# Patient Record
Sex: Male | Born: 1955
Health system: Southern US, Community
[De-identification: ages and names within clinical notes are randomized; demographics above are authoritative.]

## PROBLEM LIST (undated history)

## (undated) DIAGNOSIS — G629 Polyneuropathy, unspecified: Secondary | ICD-10-CM

## (undated) DIAGNOSIS — R0683 Snoring: Secondary | ICD-10-CM

## (undated) DIAGNOSIS — I10 Essential (primary) hypertension: Secondary | ICD-10-CM

## (undated) DIAGNOSIS — A0811 Acute gastroenteropathy due to Norwalk agent: Secondary | ICD-10-CM

## (undated) HISTORY — DX: Polyneuropathy, unspecified: G62.9

## (undated) HISTORY — DX: Acute gastroenteropathy due to Norwalk agent: A08.11

## (undated) HISTORY — PX: NASAL SINUS SURGERY: SHX719

## (undated) HISTORY — DX: Snoring: R06.83

## (undated) HISTORY — DX: Essential (primary) hypertension: I10

---

## 2016-01-03 DIAGNOSIS — E789 Disorder of lipoprotein metabolism, unspecified: Secondary | ICD-10-CM | POA: Diagnosis not present

## 2016-01-10 DIAGNOSIS — I10 Essential (primary) hypertension: Secondary | ICD-10-CM | POA: Diagnosis not present

## 2016-01-10 DIAGNOSIS — G629 Polyneuropathy, unspecified: Secondary | ICD-10-CM | POA: Diagnosis not present

## 2016-01-10 DIAGNOSIS — E789 Disorder of lipoprotein metabolism, unspecified: Secondary | ICD-10-CM | POA: Diagnosis not present

## 2016-02-19 DIAGNOSIS — H26491 Other secondary cataract, right eye: Secondary | ICD-10-CM | POA: Diagnosis not present

## 2016-03-11 DIAGNOSIS — E789 Disorder of lipoprotein metabolism, unspecified: Secondary | ICD-10-CM | POA: Diagnosis not present

## 2016-03-11 DIAGNOSIS — G629 Polyneuropathy, unspecified: Secondary | ICD-10-CM | POA: Diagnosis not present

## 2016-03-11 DIAGNOSIS — M549 Dorsalgia, unspecified: Secondary | ICD-10-CM | POA: Diagnosis not present

## 2016-03-27 DIAGNOSIS — J209 Acute bronchitis, unspecified: Secondary | ICD-10-CM | POA: Diagnosis not present

## 2016-04-03 DIAGNOSIS — E789 Disorder of lipoprotein metabolism, unspecified: Secondary | ICD-10-CM | POA: Diagnosis not present

## 2016-04-03 DIAGNOSIS — Z0000 Encounter for general adult medical examination without abnormal findings: Secondary | ICD-10-CM | POA: Diagnosis not present

## 2016-04-09 DIAGNOSIS — G629 Polyneuropathy, unspecified: Secondary | ICD-10-CM | POA: Diagnosis not present

## 2016-04-09 DIAGNOSIS — E789 Disorder of lipoprotein metabolism, unspecified: Secondary | ICD-10-CM | POA: Diagnosis not present

## 2016-05-04 DIAGNOSIS — J0100 Acute maxillary sinusitis, unspecified: Secondary | ICD-10-CM | POA: Diagnosis not present

## 2016-05-04 DIAGNOSIS — R05 Cough: Secondary | ICD-10-CM | POA: Diagnosis not present

## 2016-07-12 DIAGNOSIS — S40851A Superficial foreign body of right upper arm, initial encounter: Secondary | ICD-10-CM | POA: Diagnosis not present

## 2016-07-15 DIAGNOSIS — J0100 Acute maxillary sinusitis, unspecified: Secondary | ICD-10-CM | POA: Diagnosis not present

## 2016-07-17 DIAGNOSIS — J22 Unspecified acute lower respiratory infection: Secondary | ICD-10-CM | POA: Diagnosis not present

## 2016-08-05 DIAGNOSIS — E538 Deficiency of other specified B group vitamins: Secondary | ICD-10-CM | POA: Diagnosis not present

## 2016-08-05 DIAGNOSIS — E789 Disorder of lipoprotein metabolism, unspecified: Secondary | ICD-10-CM | POA: Diagnosis not present

## 2016-08-13 DIAGNOSIS — G629 Polyneuropathy, unspecified: Secondary | ICD-10-CM | POA: Diagnosis not present

## 2016-08-13 DIAGNOSIS — E789 Disorder of lipoprotein metabolism, unspecified: Secondary | ICD-10-CM | POA: Diagnosis not present

## 2016-08-13 DIAGNOSIS — E291 Testicular hypofunction: Secondary | ICD-10-CM | POA: Diagnosis not present

## 2016-08-19 DIAGNOSIS — M549 Dorsalgia, unspecified: Secondary | ICD-10-CM | POA: Diagnosis not present

## 2016-08-26 DIAGNOSIS — E538 Deficiency of other specified B group vitamins: Secondary | ICD-10-CM | POA: Diagnosis not present

## 2016-08-26 DIAGNOSIS — E291 Testicular hypofunction: Secondary | ICD-10-CM | POA: Diagnosis not present

## 2016-09-02 DIAGNOSIS — E538 Deficiency of other specified B group vitamins: Secondary | ICD-10-CM | POA: Diagnosis not present

## 2016-09-09 DIAGNOSIS — E538 Deficiency of other specified B group vitamins: Secondary | ICD-10-CM | POA: Diagnosis not present

## 2016-09-16 DIAGNOSIS — E538 Deficiency of other specified B group vitamins: Secondary | ICD-10-CM | POA: Diagnosis not present

## 2016-10-10 DIAGNOSIS — J029 Acute pharyngitis, unspecified: Secondary | ICD-10-CM | POA: Diagnosis not present

## 2016-10-10 DIAGNOSIS — I10 Essential (primary) hypertension: Secondary | ICD-10-CM | POA: Diagnosis not present

## 2016-10-10 DIAGNOSIS — E789 Disorder of lipoprotein metabolism, unspecified: Secondary | ICD-10-CM | POA: Diagnosis not present

## 2016-10-10 DIAGNOSIS — G629 Polyneuropathy, unspecified: Secondary | ICD-10-CM | POA: Diagnosis not present

## 2016-10-18 DIAGNOSIS — E538 Deficiency of other specified B group vitamins: Secondary | ICD-10-CM | POA: Diagnosis not present

## 2016-11-01 DIAGNOSIS — J111 Influenza due to unidentified influenza virus with other respiratory manifestations: Secondary | ICD-10-CM | POA: Diagnosis not present

## 2016-11-06 DIAGNOSIS — J329 Chronic sinusitis, unspecified: Secondary | ICD-10-CM | POA: Diagnosis not present

## 2016-11-06 DIAGNOSIS — R05 Cough: Secondary | ICD-10-CM | POA: Diagnosis not present

## 2016-12-04 DIAGNOSIS — E789 Disorder of lipoprotein metabolism, unspecified: Secondary | ICD-10-CM | POA: Diagnosis not present

## 2016-12-11 DIAGNOSIS — E789 Disorder of lipoprotein metabolism, unspecified: Secondary | ICD-10-CM | POA: Diagnosis not present

## 2016-12-11 DIAGNOSIS — I10 Essential (primary) hypertension: Secondary | ICD-10-CM | POA: Diagnosis not present

## 2016-12-11 DIAGNOSIS — G629 Polyneuropathy, unspecified: Secondary | ICD-10-CM | POA: Diagnosis not present

## 2017-02-05 DIAGNOSIS — E789 Disorder of lipoprotein metabolism, unspecified: Secondary | ICD-10-CM | POA: Diagnosis not present

## 2017-02-12 DIAGNOSIS — E789 Disorder of lipoprotein metabolism, unspecified: Secondary | ICD-10-CM | POA: Diagnosis not present

## 2017-02-12 DIAGNOSIS — G629 Polyneuropathy, unspecified: Secondary | ICD-10-CM | POA: Diagnosis not present

## 2017-02-12 DIAGNOSIS — I10 Essential (primary) hypertension: Secondary | ICD-10-CM | POA: Diagnosis not present

## 2017-02-19 DIAGNOSIS — H2512 Age-related nuclear cataract, left eye: Secondary | ICD-10-CM | POA: Diagnosis not present

## 2017-02-19 DIAGNOSIS — H524 Presbyopia: Secondary | ICD-10-CM | POA: Diagnosis not present

## 2017-04-01 ENCOUNTER — Other Ambulatory Visit: Payer: Self-pay | Admitting: Endocrinology

## 2017-04-01 DIAGNOSIS — I158 Other secondary hypertension: Secondary | ICD-10-CM

## 2017-04-10 ENCOUNTER — Ambulatory Visit
Admission: RE | Admit: 2017-04-10 | Discharge: 2017-04-10 | Disposition: A | Discharge status: Home / Self Care | Payer: Self-pay | Source: Ambulatory Visit | Attending: Endocrinology | Admitting: Endocrinology

## 2017-04-10 ENCOUNTER — Other Ambulatory Visit: Payer: Self-pay

## 2017-04-10 ENCOUNTER — Ambulatory Visit
Admission: RE | Admit: 2017-04-10 | Discharge: 2017-04-10 | Disposition: A | Discharge status: Home / Self Care | Payer: BLUE CROSS/BLUE SHIELD | Source: Ambulatory Visit | Attending: Endocrinology | Admitting: Endocrinology

## 2017-04-10 DIAGNOSIS — I10 Essential (primary) hypertension: Secondary | ICD-10-CM | POA: Diagnosis not present

## 2017-04-10 DIAGNOSIS — I158 Other secondary hypertension: Secondary | ICD-10-CM

## 2017-04-30 DIAGNOSIS — Z6833 Body mass index (BMI) 33.0-33.9, adult: Secondary | ICD-10-CM | POA: Diagnosis not present

## 2017-04-30 DIAGNOSIS — I10 Essential (primary) hypertension: Secondary | ICD-10-CM | POA: Diagnosis not present

## 2017-05-05 ENCOUNTER — Other Ambulatory Visit: Payer: Self-pay | Admitting: Nephrology

## 2017-05-07 DIAGNOSIS — H6123 Impacted cerumen, bilateral: Secondary | ICD-10-CM | POA: Diagnosis not present

## 2017-05-07 DIAGNOSIS — J309 Allergic rhinitis, unspecified: Secondary | ICD-10-CM | POA: Diagnosis not present

## 2017-05-19 ENCOUNTER — Other Ambulatory Visit: Payer: Self-pay | Admitting: Nephrology

## 2017-05-19 DIAGNOSIS — I701 Atherosclerosis of renal artery: Secondary | ICD-10-CM

## 2017-06-09 DIAGNOSIS — Z125 Encounter for screening for malignant neoplasm of prostate: Secondary | ICD-10-CM | POA: Diagnosis not present

## 2017-06-09 DIAGNOSIS — Z0000 Encounter for general adult medical examination without abnormal findings: Secondary | ICD-10-CM | POA: Diagnosis not present

## 2017-06-16 DIAGNOSIS — E538 Deficiency of other specified B group vitamins: Secondary | ICD-10-CM | POA: Diagnosis not present

## 2017-06-16 DIAGNOSIS — I10 Essential (primary) hypertension: Secondary | ICD-10-CM | POA: Diagnosis not present

## 2017-06-16 DIAGNOSIS — Z1212 Encounter for screening for malignant neoplasm of rectum: Secondary | ICD-10-CM | POA: Diagnosis not present

## 2017-06-16 DIAGNOSIS — J309 Allergic rhinitis, unspecified: Secondary | ICD-10-CM | POA: Diagnosis not present

## 2017-06-16 DIAGNOSIS — Z23 Encounter for immunization: Secondary | ICD-10-CM | POA: Diagnosis not present

## 2017-06-16 DIAGNOSIS — L918 Other hypertrophic disorders of the skin: Secondary | ICD-10-CM | POA: Diagnosis not present

## 2017-06-16 DIAGNOSIS — E789 Disorder of lipoprotein metabolism, unspecified: Secondary | ICD-10-CM | POA: Diagnosis not present

## 2017-06-16 DIAGNOSIS — Z0000 Encounter for general adult medical examination without abnormal findings: Secondary | ICD-10-CM | POA: Diagnosis not present

## 2017-07-08 DIAGNOSIS — R490 Dysphonia: Secondary | ICD-10-CM | POA: Diagnosis not present

## 2017-07-29 DIAGNOSIS — I10 Essential (primary) hypertension: Secondary | ICD-10-CM | POA: Diagnosis not present

## 2017-07-29 DIAGNOSIS — E538 Deficiency of other specified B group vitamins: Secondary | ICD-10-CM | POA: Diagnosis not present

## 2017-07-29 DIAGNOSIS — R490 Dysphonia: Secondary | ICD-10-CM | POA: Diagnosis not present

## 2017-08-01 DIAGNOSIS — I10 Essential (primary) hypertension: Secondary | ICD-10-CM | POA: Diagnosis not present

## 2017-08-25 DIAGNOSIS — J309 Allergic rhinitis, unspecified: Secondary | ICD-10-CM | POA: Diagnosis not present

## 2017-09-04 DIAGNOSIS — I10 Essential (primary) hypertension: Secondary | ICD-10-CM | POA: Diagnosis not present

## 2017-09-04 DIAGNOSIS — J0100 Acute maxillary sinusitis, unspecified: Secondary | ICD-10-CM | POA: Diagnosis not present

## 2017-10-07 DIAGNOSIS — I10 Essential (primary) hypertension: Secondary | ICD-10-CM | POA: Diagnosis not present

## 2017-11-11 DIAGNOSIS — E789 Disorder of lipoprotein metabolism, unspecified: Secondary | ICD-10-CM | POA: Diagnosis not present

## 2017-11-11 DIAGNOSIS — I10 Essential (primary) hypertension: Secondary | ICD-10-CM | POA: Diagnosis not present

## 2017-11-12 DIAGNOSIS — I10 Essential (primary) hypertension: Secondary | ICD-10-CM | POA: Diagnosis not present

## 2017-11-22 DIAGNOSIS — R51 Headache: Secondary | ICD-10-CM | POA: Diagnosis not present

## 2017-11-22 DIAGNOSIS — R0989 Other specified symptoms and signs involving the circulatory and respiratory systems: Secondary | ICD-10-CM | POA: Diagnosis not present

## 2018-01-16 DIAGNOSIS — J069 Acute upper respiratory infection, unspecified: Secondary | ICD-10-CM | POA: Diagnosis not present

## 2018-02-20 DIAGNOSIS — H2512 Age-related nuclear cataract, left eye: Secondary | ICD-10-CM | POA: Diagnosis not present

## 2018-04-27 IMAGING — US US RENAL ARTERY STENOSIS
1 series · 13 of 25 positions shown · non-contrast
Comparison: None.

CLINICAL DATA: Hypertension, uncontrolled.

EXAM:
RENAL/URINARY TRACT ULTRASOUND
RENAL DUPLEX DOPPLER ULTRASOUND

[Series 1: us renal artery stenosis · 0.25mm/px · 13 of 70 slices shown]
[im 1/70]
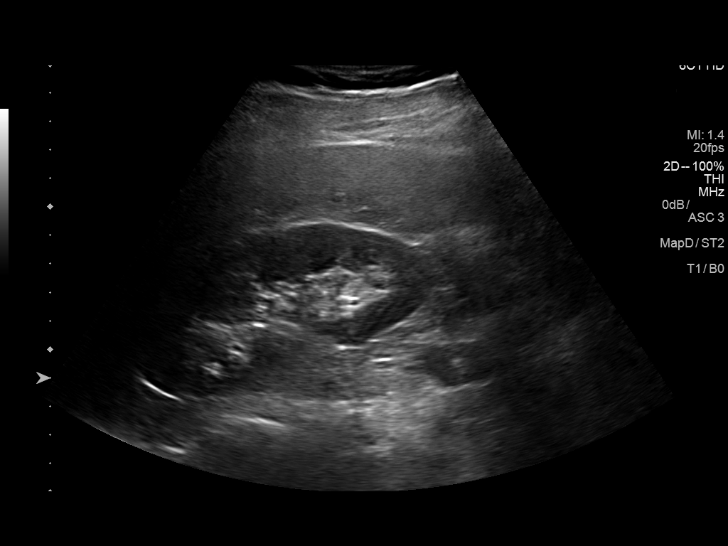
[im 6/70]
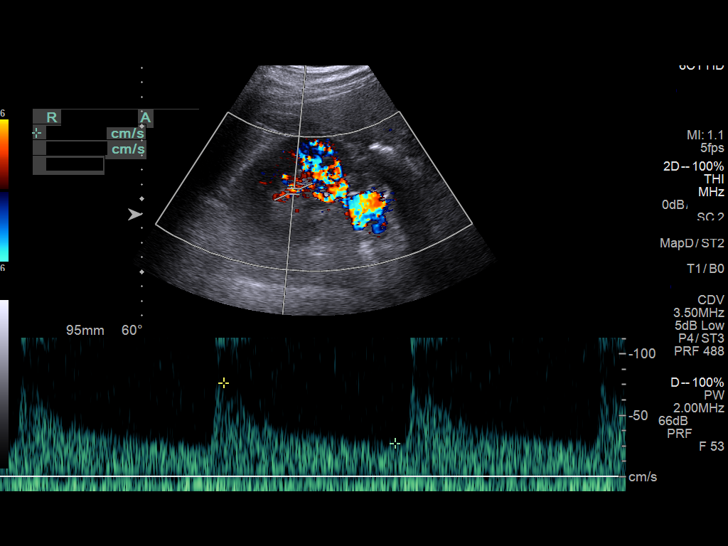
[im 12/70]
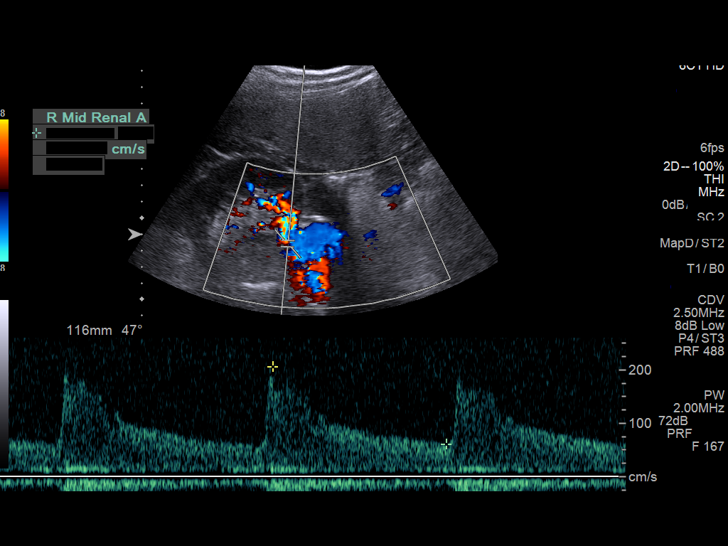
[im 18/70]
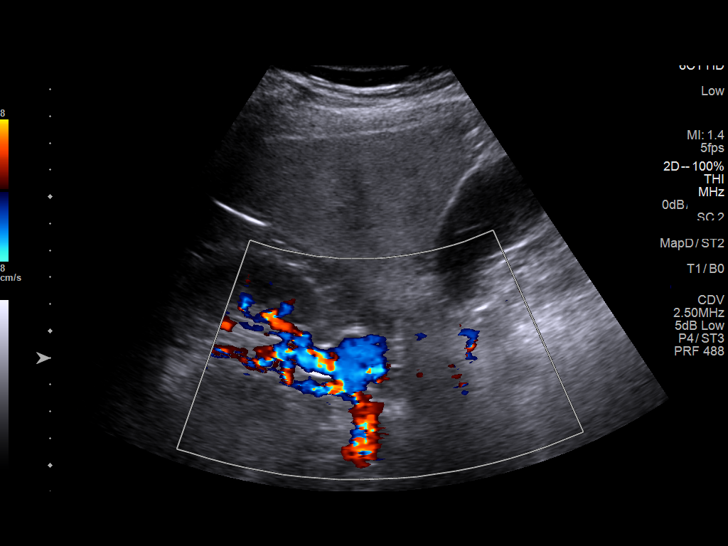
[im 24/70]
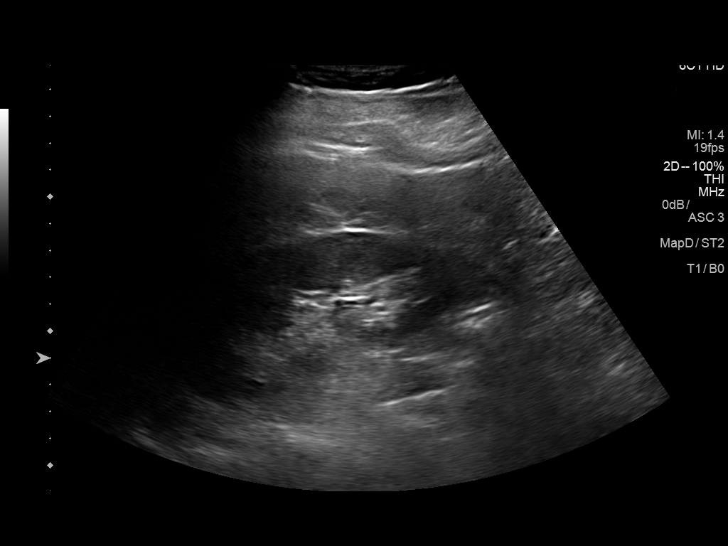
[im 29/70]
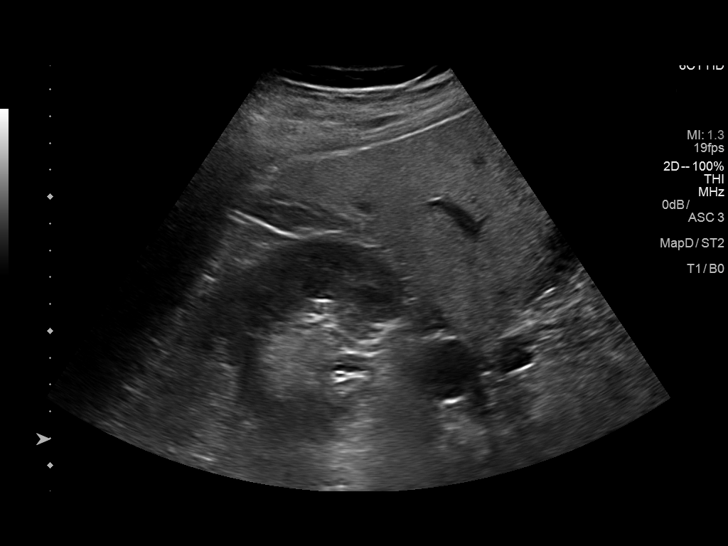
[im 35/70]
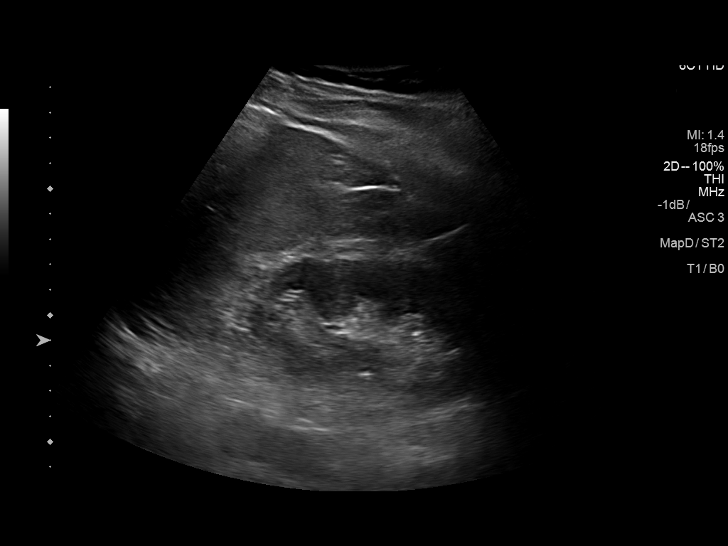
[im 41/70]
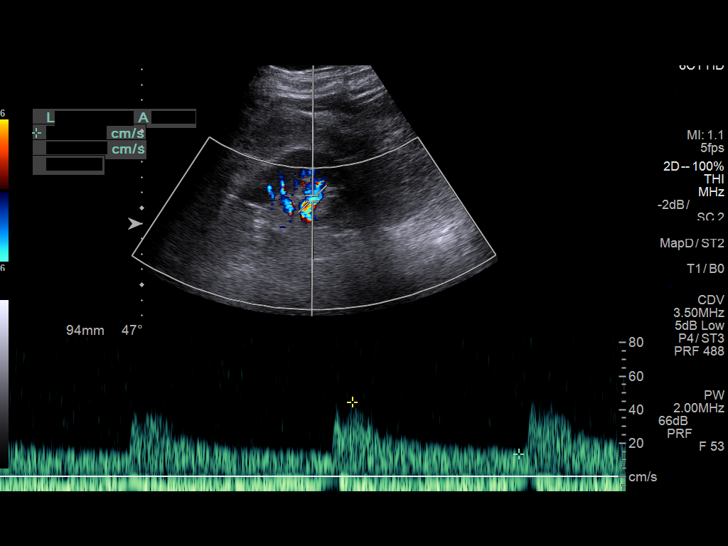
[im 47/70]
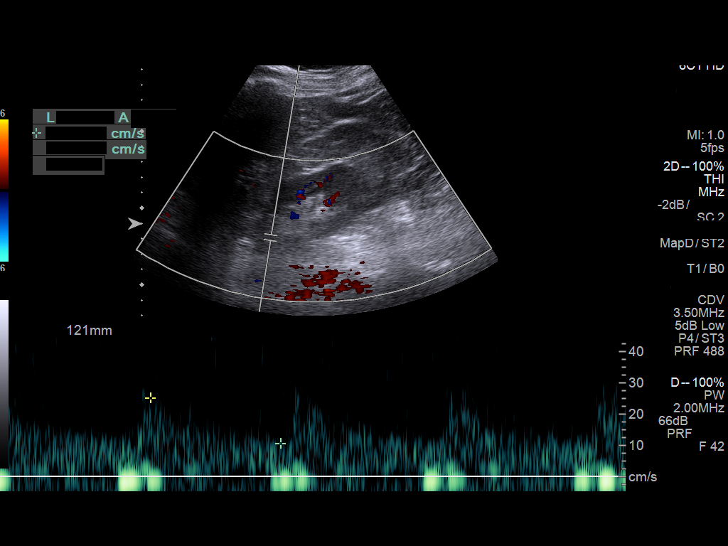
[im 52/70]
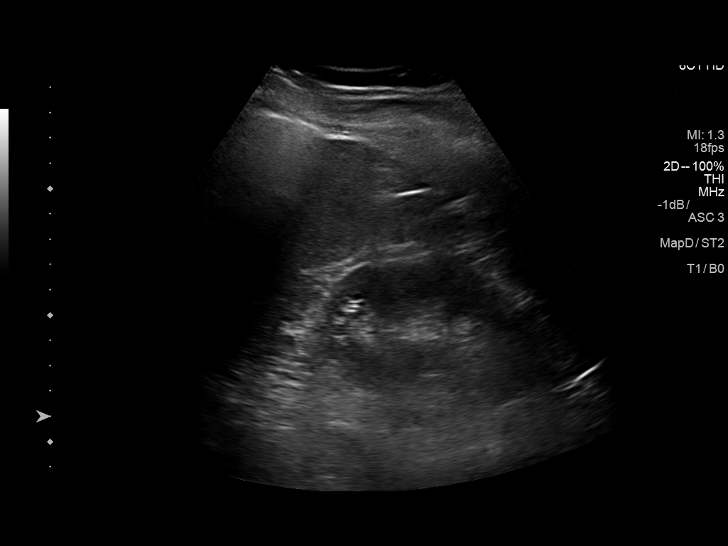
[im 58/70]
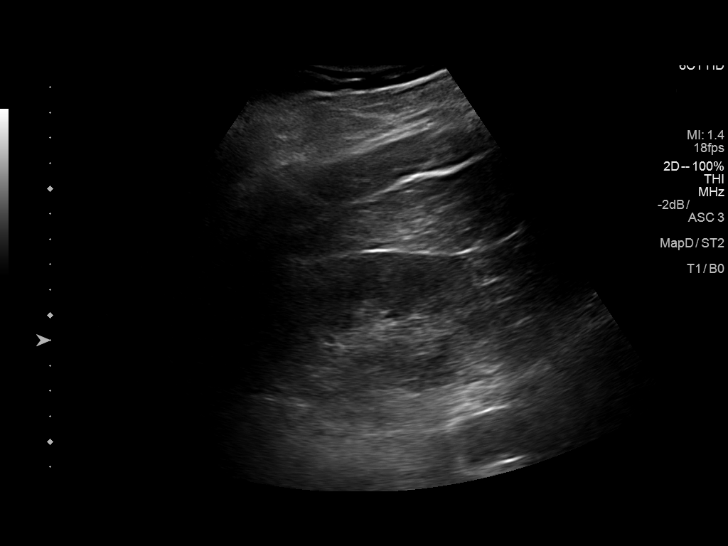
[im 64/70]
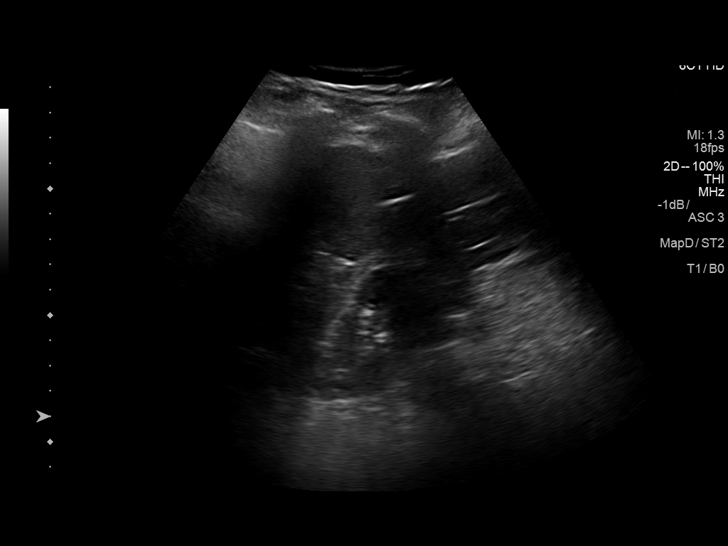
[im 70/70]
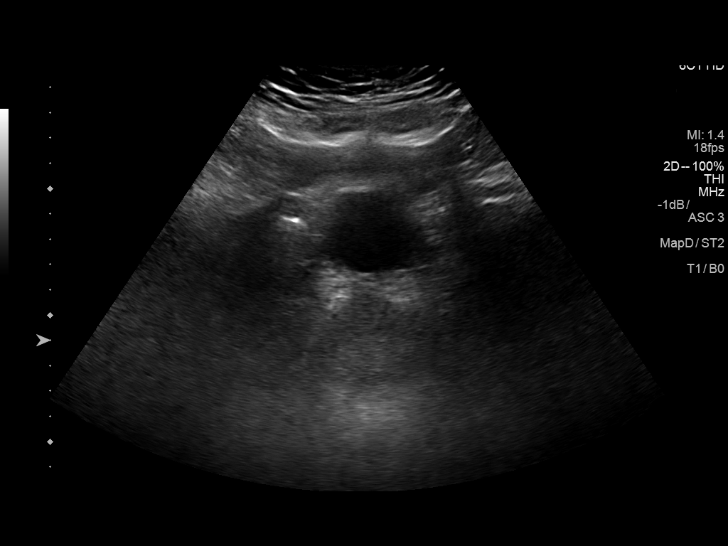

[13 of 25 positions shown; findings below may reference images not displayed]

FINDINGS: Right Kidney:

Length: 11.5 cm. Echogenicity within normal limits. No mass or
hydronephrosis visualized. Antegrade flow in the renal vein.

Left Kidney:

Length: 10.8 cm. Echogenicity within normal limits. No mass or
hydronephrosis visualized. Antegrade flow in the renal vein.

Bladder:  Incompletely distended, unremarkable

RENAL DUPLEX ULTRASOUND

Right Renal Artery Velocities:

Origin:  96 cm/sec

Mid:  207 cm/sec

Hilum:  307 cm/sec

Interlobar:  57 cm/sec

Arcuate:  18 Cm/sec

Left Renal Artery Velocities:

Origin:  145 cm/sec

Mid:  71 cm/sec

Hilum:  78 cm/sec

Interlobar:  42 cm/sec

Arcuate:  19 cm/sec

Aortic Velocity:  92 Cm/sec

Right Renal-Aortic Ratios:

Origin:

Mid:

Hilum:

Interlobar:

Arcuate:

Left Renal-Aortic Ratios:

Origin:

Mid:

Hilum:

Interlobar:

Arcuate:
IMPRESSION: 1. Elevated peak systolic velocities in the right renal artery
suggesting hemodynamically significant stenosis. Consider catheter
angiography for further evaluation, as the lesion may be amenable to
percutaneous treatment if confirmed to be significant.

## 2018-05-26 DIAGNOSIS — J039 Acute tonsillitis, unspecified: Secondary | ICD-10-CM | POA: Diagnosis not present

## 2018-05-26 DIAGNOSIS — J069 Acute upper respiratory infection, unspecified: Secondary | ICD-10-CM | POA: Diagnosis not present

## 2018-06-15 DIAGNOSIS — I10 Essential (primary) hypertension: Secondary | ICD-10-CM | POA: Diagnosis not present

## 2018-06-15 DIAGNOSIS — Z125 Encounter for screening for malignant neoplasm of prostate: Secondary | ICD-10-CM | POA: Diagnosis not present

## 2018-06-23 DIAGNOSIS — L219 Seborrheic dermatitis, unspecified: Secondary | ICD-10-CM | POA: Diagnosis not present

## 2018-06-23 DIAGNOSIS — Z1212 Encounter for screening for malignant neoplasm of rectum: Secondary | ICD-10-CM | POA: Diagnosis not present

## 2018-06-23 DIAGNOSIS — Z23 Encounter for immunization: Secondary | ICD-10-CM | POA: Diagnosis not present

## 2018-06-23 DIAGNOSIS — Z0000 Encounter for general adult medical examination without abnormal findings: Secondary | ICD-10-CM | POA: Diagnosis not present

## 2018-07-02 DIAGNOSIS — H60539 Acute contact otitis externa, unspecified ear: Secondary | ICD-10-CM | POA: Diagnosis not present

## 2018-08-06 DIAGNOSIS — I10 Essential (primary) hypertension: Secondary | ICD-10-CM | POA: Diagnosis not present

## 2018-11-03 DIAGNOSIS — J312 Chronic pharyngitis: Secondary | ICD-10-CM | POA: Diagnosis not present

## 2018-11-03 DIAGNOSIS — J028 Acute pharyngitis due to other specified organisms: Secondary | ICD-10-CM | POA: Diagnosis not present

## 2018-11-24 DIAGNOSIS — J411 Mucopurulent chronic bronchitis: Secondary | ICD-10-CM | POA: Diagnosis not present

## 2019-02-17 DIAGNOSIS — G629 Polyneuropathy, unspecified: Secondary | ICD-10-CM | POA: Diagnosis not present

## 2019-03-24 DIAGNOSIS — H25812 Combined forms of age-related cataract, left eye: Secondary | ICD-10-CM | POA: Diagnosis not present

## 2019-04-22 DIAGNOSIS — G629 Polyneuropathy, unspecified: Secondary | ICD-10-CM | POA: Diagnosis not present

## 2019-06-25 DIAGNOSIS — Z1159 Encounter for screening for other viral diseases: Secondary | ICD-10-CM | POA: Diagnosis not present

## 2019-06-25 DIAGNOSIS — Z0000 Encounter for general adult medical examination without abnormal findings: Secondary | ICD-10-CM | POA: Diagnosis not present

## 2019-06-25 DIAGNOSIS — Z125 Encounter for screening for malignant neoplasm of prostate: Secondary | ICD-10-CM | POA: Diagnosis not present

## 2019-06-25 DIAGNOSIS — I10 Essential (primary) hypertension: Secondary | ICD-10-CM | POA: Diagnosis not present

## 2019-06-30 DIAGNOSIS — I10 Essential (primary) hypertension: Secondary | ICD-10-CM | POA: Diagnosis not present

## 2019-06-30 DIAGNOSIS — E789 Disorder of lipoprotein metabolism, unspecified: Secondary | ICD-10-CM | POA: Diagnosis not present

## 2019-06-30 DIAGNOSIS — Z23 Encounter for immunization: Secondary | ICD-10-CM | POA: Diagnosis not present

## 2019-06-30 DIAGNOSIS — Z0000 Encounter for general adult medical examination without abnormal findings: Secondary | ICD-10-CM | POA: Diagnosis not present

## 2019-09-06 DIAGNOSIS — H40052 Ocular hypertension, left eye: Secondary | ICD-10-CM | POA: Diagnosis not present

## 2019-10-05 DIAGNOSIS — Z20828 Contact with and (suspected) exposure to other viral communicable diseases: Secondary | ICD-10-CM | POA: Diagnosis not present

## 2019-10-05 DIAGNOSIS — J3489 Other specified disorders of nose and nasal sinuses: Secondary | ICD-10-CM | POA: Diagnosis not present

## 2019-10-26 DIAGNOSIS — E538 Deficiency of other specified B group vitamins: Secondary | ICD-10-CM | POA: Diagnosis not present

## 2019-10-26 DIAGNOSIS — R7301 Impaired fasting glucose: Secondary | ICD-10-CM | POA: Diagnosis not present

## 2019-10-26 DIAGNOSIS — R400 Somnolence: Secondary | ICD-10-CM | POA: Diagnosis not present

## 2019-10-26 DIAGNOSIS — I10 Essential (primary) hypertension: Secondary | ICD-10-CM | POA: Diagnosis not present

## 2019-10-26 DIAGNOSIS — E785 Hyperlipidemia, unspecified: Secondary | ICD-10-CM | POA: Diagnosis not present

## 2019-10-26 DIAGNOSIS — R7303 Prediabetes: Secondary | ICD-10-CM | POA: Diagnosis not present

## 2019-11-24 DIAGNOSIS — L2084 Intrinsic (allergic) eczema: Secondary | ICD-10-CM | POA: Diagnosis not present

## 2020-01-25 DIAGNOSIS — I10 Essential (primary) hypertension: Secondary | ICD-10-CM | POA: Diagnosis not present

## 2020-01-25 DIAGNOSIS — R7303 Prediabetes: Secondary | ICD-10-CM | POA: Diagnosis not present

## 2020-01-25 DIAGNOSIS — G629 Polyneuropathy, unspecified: Secondary | ICD-10-CM | POA: Diagnosis not present

## 2020-01-25 DIAGNOSIS — E785 Hyperlipidemia, unspecified: Secondary | ICD-10-CM | POA: Diagnosis not present

## 2020-02-09 DIAGNOSIS — G4719 Other hypersomnia: Secondary | ICD-10-CM | POA: Diagnosis not present

## 2020-02-17 DIAGNOSIS — J029 Acute pharyngitis, unspecified: Secondary | ICD-10-CM | POA: Diagnosis not present

## 2020-03-06 DIAGNOSIS — H40052 Ocular hypertension, left eye: Secondary | ICD-10-CM | POA: Diagnosis not present

## 2020-03-08 DIAGNOSIS — G4733 Obstructive sleep apnea (adult) (pediatric): Secondary | ICD-10-CM | POA: Diagnosis not present

## 2020-03-08 DIAGNOSIS — G4719 Other hypersomnia: Secondary | ICD-10-CM | POA: Diagnosis not present

## 2020-06-06 DIAGNOSIS — R400 Somnolence: Secondary | ICD-10-CM | POA: Diagnosis not present

## 2020-06-06 DIAGNOSIS — Z0000 Encounter for general adult medical examination without abnormal findings: Secondary | ICD-10-CM | POA: Diagnosis not present

## 2020-07-05 DIAGNOSIS — J069 Acute upper respiratory infection, unspecified: Secondary | ICD-10-CM | POA: Diagnosis not present

## 2020-07-05 DIAGNOSIS — J029 Acute pharyngitis, unspecified: Secondary | ICD-10-CM | POA: Diagnosis not present

## 2020-07-06 DIAGNOSIS — Z125 Encounter for screening for malignant neoplasm of prostate: Secondary | ICD-10-CM | POA: Diagnosis not present

## 2020-07-06 DIAGNOSIS — Z0000 Encounter for general adult medical examination without abnormal findings: Secondary | ICD-10-CM | POA: Diagnosis not present

## 2020-07-06 DIAGNOSIS — I10 Essential (primary) hypertension: Secondary | ICD-10-CM | POA: Diagnosis not present

## 2020-07-06 DIAGNOSIS — E538 Deficiency of other specified B group vitamins: Secondary | ICD-10-CM | POA: Diagnosis not present

## 2020-07-11 DIAGNOSIS — Z23 Encounter for immunization: Secondary | ICD-10-CM | POA: Diagnosis not present

## 2020-07-11 DIAGNOSIS — G629 Polyneuropathy, unspecified: Secondary | ICD-10-CM | POA: Diagnosis not present

## 2020-07-11 DIAGNOSIS — G4733 Obstructive sleep apnea (adult) (pediatric): Secondary | ICD-10-CM | POA: Diagnosis not present

## 2020-07-11 DIAGNOSIS — G4719 Other hypersomnia: Secondary | ICD-10-CM | POA: Diagnosis not present

## 2020-07-11 DIAGNOSIS — I10 Essential (primary) hypertension: Secondary | ICD-10-CM | POA: Diagnosis not present

## 2020-07-13 ENCOUNTER — Encounter: Payer: Self-pay | Admitting: Neurology

## 2020-07-13 ENCOUNTER — Ambulatory Visit: Payer: BC Managed Care – PPO | Admitting: Neurology

## 2020-07-13 VITALS — BP 122/84 | HR 74 | Ht 72.0 in | Wt 232.3 lb

## 2020-07-13 DIAGNOSIS — R519 Headache, unspecified: Secondary | ICD-10-CM

## 2020-07-13 DIAGNOSIS — R351 Nocturia: Secondary | ICD-10-CM | POA: Diagnosis not present

## 2020-07-13 DIAGNOSIS — E66811 Obesity, class 1: Secondary | ICD-10-CM

## 2020-07-13 DIAGNOSIS — G4719 Other hypersomnia: Secondary | ICD-10-CM

## 2020-07-13 DIAGNOSIS — E669 Obesity, unspecified: Secondary | ICD-10-CM

## 2020-07-13 DIAGNOSIS — R0683 Snoring: Secondary | ICD-10-CM

## 2020-07-13 NOTE — Patient Instructions (Signed)

## 2020-07-13 NOTE — Progress Notes (Signed)
Subjective:    Patient ID: Jeffrey Luna is a 64 y.o. male.  HPI     Jeffrey Foley, Jeffrey Luna, Jeffrey Luna [facility] [address] [address] [place], [place] [address]  Dear Dr. Renne Crigler,   I saw your patient, Jeffrey Luna, upon your kind request in my sleep clinic today for initial consultation of his sleep disorder, in particular, concern for underlying obstructive sleep apnea.  The patient is unaccompanied today.  As you know, Jeffrey Luna is a 64 year old right-handed gentleman with an underlying medical history of hypertension, vitamin B12 deficiency, edema, psoriasis, allergic rhinitis, renal artery stenosis, hyperlipidemia, reflux disease, neuropathy and obesity, who reports snoring and excessive daytime somnolence.  I reviewed your office note from 06/06/2020.  He has been on modafinil.  His duloxetine was recently reduced. In fact, he stopped taking it.  Of note, he also takes Lyrica 150 mg twice daily.  He has been on Lyrica for several years.  Modafinil is 200 mg once daily.  His Epworth sleepiness score is 12 out of 24, fatigue severity score is 30 out of 63.  He is married and lives with his wife, they have 2 grown children.  He works as a Education officer, environmental.  They have 1 cat in the household.  Bedtime is generally around 11 but he often falls asleep before then.  He sleeps in a recliner.  He has done so for the past 2 or 3 years.  Rise time is between 530 and 6 AM.  He has nocturia about 2-3 times per average night and has had the occasional morning headache.  He has become sleepy at the wheel at times but has not fallen asleep at the wheel.  He has had a 40 pound weight gain over the past 5 years approximately.  He quit smoking some 25 to 30 years ago, drinks no alcohol, caffeine in the form of soda, 1/day on average.  His Past Medical History Is Significant For: Past Medical History:  Diagnosis Date  . High blood pressure   . Neuropathy   . Snoring     His Past Surgical  History Is Significant For: Past Surgical History:  Procedure Laterality Date  . NASAL SINUS SURGERY      His Family History Is Significant For: Family History  Problem Relation Age of Onset  . Dementia Mother   . Heart attack Father     His Social History Is Significant For: Social History   Socioeconomic History  . Marital status: Married    Spouse name: Not on file  . Number of children: Not on file  . Years of education: Not on file  . Highest education level: Not on file  Occupational History  . Not on file  Tobacco Use  . Smoking status: Former Smoker    Quit date: 2010    Years since quitting: 11.7  . Smokeless tobacco: Never Used  Substance and Sexual Activity  . Alcohol use: Never  . Drug use: Never  . Sexual activity: Not on file  Other Topics Concern  . Not on file  Social History Narrative  . Not on file   Social Determinants of Health   Financial Resource Strain:   . Difficulty of Paying Living Expenses: Not on file  Food Insecurity:   . Worried About Programme researcher, broadcasting/film/video in the Last Year: Not on file  . Ran Out of Food in the Last Year: Not on file  Transportation Needs:   .  Lack of Transportation (Medical): Not on file  . Lack of Transportation (Non-Medical): Not on file  Physical Activity:   . Days of Exercise per Week: Not on file  . Minutes of Exercise per Session: Not on file  Stress:   . Feeling of Stress : Not on file  Social Connections:   . Frequency of Communication with Friends and Family: Not on file  . Frequency of Social Gatherings with Friends and Family: Not on file  . Attends Religious Services: Not on file  . Active Member of Clubs or Organizations: Not on file  . Attends Banker Meetings: Not on file  . Marital Status: Not on file    His Allergies Are:  Allergies  Allergen Reactions  . Prozac [Fluoxetine]     Heart Rate increased   :   His Current Medications Are:  Outpatient Encounter Medications as of  07/13/2020  Medication Sig  . amLODipine (NORVASC) 5 MG tablet Take 5 mg by mouth daily.  . Ascorbic Acid (VITAMIN C PO) Take by mouth.  . modafinil (PROVIGIL) 100 MG tablet Take 100 mg by mouth daily.  . Multiple Vitamins-Minerals (PRESERVISION AREDS PO) Take by mouth.  . Multiple Vitamins-Minerals (ZINC PO) Take by mouth.  . pantoprazole (PROTONIX) 40 MG tablet Take 40 mg by mouth 2 (two) times daily.  . pregabalin (LYRICA) 150 MG capsule Take 1 capsule by mouth 2 (two) times daily.  . RESTASIS 0.05 % ophthalmic emulsion   . SENNA CO by Combination route.  . triamterene-hydrochlorothiazide (MAXZIDE-25) 37.5-25 MG tablet Take 1 tablet by mouth every morning.   No facility-administered encounter medications on file as of 07/13/2020.  :  Review of Systems:  Out of a complete 14 point review of systems, all are reviewed and negative with the exception of these symptoms as listed below:  Review of Systems  Neurological:       Here for sleep consult. No prior sleep study. Pt reports h/a and snoring is present.  Epworth Sleepiness Scale 0= would never doze 1= slight chance of dozing 2= moderate chance of dozing 3= high chance of dozing  Sitting and reading:3 Watching TV:3 Sitting inactive in a public place (ex. Theater or meeting):2 As a passenger in a car for an hour without a break:1 Lying down to rest in the afternoon:1 Sitting and talking to someone:0 Sitting quietly after lunch (no alcohol):1 In a car, while stopped in traffic:1 Total:12     Objective:  Neurological Exam  Physical Exam Physical Examination:   Vitals:   07/13/20 1330  BP: 122/84  Pulse: 74  SpO2: 95%    General Examination: The patient is a very pleasant 64 y.o. male in no acute distress. He appears well-developed and well-nourished and well groomed.   HEENT: Normocephalic, atraumatic, pupils are equal, round and reactive to light, extraocular tracking is good without limitation to gaze excursion  or nystagmus noted. Hearing is grossly intact. Face is symmetric with normal facial animation. Speech is clear with no dysarthria noted. There is no hypophonia. There is no lip, neck/head, jaw or voice tremor. Neck is supple with full range of passive and active motion. There are no carotid bruits on auscultation. Oropharynx exam reveals: moderate mouth dryness, adequate dental hygiene with several missing teeth, mild airway crowding secondary to small airway entry and redundant soft palate, tonsils on the smaller side, Mallampati class III, neck circumference of 18 inches.  Tongue protrudes centrally in palate elevates symmetrically.   Chest: Clear  to auscultation without wheezing, rhonchi or crackles noted.  Heart: S1+S2+0, regular and normal without murmurs, rubs or gallops noted.   Abdomen: Soft, non-tender and non-distended with normal bowel sounds appreciated on auscultation.  Extremities: There is trace pitting edema in the distal lower extremities bilaterally.   Skin: Warm and dry with evidence of psoriatic plaques particularly in the right shin area and over the left elbow.    Musculoskeletal: exam reveals no obvious joint deformities, tenderness or joint swelling or erythema.   Neurologically:  Mental status: The patient is awake, alert and oriented in all 4 spheres. His immediate and remote memory, attention, language skills and fund of knowledge are appropriate. There is no evidence of aphasia, agnosia, apraxia or anomia. Speech is clear with normal prosody and enunciation. Thought process is linear. Mood is normal and affect is normal.  Cranial nerves II - XII are as described above under HEENT exam.  Motor exam: Normal bulk, strength and tone is noted. There is no tremor, fine motor skills and coordination: grossly intact.  Cerebellar testing: No dysmetria or intention tremor. There is no truncal or gait ataxia.  Sensory exam: intact to light touch in the upper and lower extremities.   Gait, station and balance: He stands easily. No veering to one side is noted. No leaning to one side is noted. Posture is age-appropriate and stance is narrow based. Gait shows normal stride length and normal pace. No problems turning are noted.   Assessment and plan:  In summary, Jeffrey Luna is a very pleasant 64 y.o.-year old male  with an underlying medical history of hypertension, vitamin B12 deficiency, edema, psoriasis, allergic rhinitis, renal artery stenosis, hyperlipidemia, reflux disease, neuropathy and obesity, whose history and physical exam are concerning for obstructive sleep apnea (OSA). I had a long chat with the patient about my findings and the diagnosis of OSA, its prognosis and treatment options. We talked about medical treatments, surgical interventions and non-pharmacological approaches. I explained in particular the risks and ramifications of untreated moderate to severe OSA, especially with respect to developing cardiovascular disease down the Road, including congestive heart failure, difficult to treat hypertension, cardiac arrhythmias, or stroke. Even type 2 diabetes has, in part, been linked to untreated OSA. Symptoms of untreated OSA include daytime sleepiness, memory problems, mood irritability and mood disorder such as depression and anxiety, lack of energy, as well as recurrent headaches, especially morning headaches. We talked about trying to maintain a healthy lifestyle in general, as well as the importance of weight control. We also talked about the importance of good sleep hygiene. I recommended the following at this time: sleep study.  He is advised not to drive when feeling sleepy. I explained the sleep test procedure to the patient and also outlined possible surgical and non-surgical treatment options of OSA, including the use of a custom-made dental device (which would require a referral to a specialist dentist or oral surgeon), upper airway surgical options, such as  traditional UPPP or a novel less invasive surgical option in the form of Inspire hypoglossal nerve stimulation (which would involve a referral to an ENT surgeon). I answered all his questions today and the patient was in agreement. I plan to see him back after the sleep study is completed and encouraged him to call with any interim questions, concerns, problems or updates.   Thank you very much for allowing me to participate in the care of this nice patient. If I can be of any further assistance to you  please do not hesitate to call me at [phone].  Sincerely,   Star Age, Jeffrey Luna, Jeffrey Luna

## 2020-07-20 DIAGNOSIS — L821 Other seborrheic keratosis: Secondary | ICD-10-CM | POA: Diagnosis not present

## 2020-07-20 DIAGNOSIS — L299 Pruritus, unspecified: Secondary | ICD-10-CM | POA: Diagnosis not present

## 2020-07-20 DIAGNOSIS — D485 Neoplasm of uncertain behavior of skin: Secondary | ICD-10-CM | POA: Diagnosis not present

## 2020-08-09 DIAGNOSIS — L400 Psoriasis vulgaris: Secondary | ICD-10-CM | POA: Diagnosis not present

## 2020-08-28 ENCOUNTER — Ambulatory Visit (INDEPENDENT_AMBULATORY_CARE_PROVIDER_SITE_OTHER): Payer: BC Managed Care – PPO | Admitting: Neurology

## 2020-08-28 DIAGNOSIS — R351 Nocturia: Secondary | ICD-10-CM

## 2020-08-28 DIAGNOSIS — G4719 Other hypersomnia: Secondary | ICD-10-CM

## 2020-08-28 DIAGNOSIS — E669 Obesity, unspecified: Secondary | ICD-10-CM

## 2020-08-28 DIAGNOSIS — G4733 Obstructive sleep apnea (adult) (pediatric): Secondary | ICD-10-CM | POA: Diagnosis not present

## 2020-08-28 DIAGNOSIS — R519 Headache, unspecified: Secondary | ICD-10-CM

## 2020-08-28 DIAGNOSIS — R0683 Snoring: Secondary | ICD-10-CM

## 2020-09-01 ENCOUNTER — Telehealth: Payer: Self-pay | Admitting: Neurology

## 2020-09-01 NOTE — Procedures (Signed)
   [facility]  HOME SLEEP TEST (Watch PAT)  STUDY DATE: 08/28/20  DOB: 1956-07-02  MRN: [id number]  ORDERING CLINICIAN: Huston Foley, MD, PhD   REFERRING CLINICIAN: Merri Brunette, MD   CLINICAL INFORMATION/HISTORY: 64 year old man with a history of hypertension, vitamin B12 deficiency, edema, psoriasis, allergic rhinitis, renal artery stenosis, hyperlipidemia, reflux disease, neuropathy and obesity, who reports snoring and excessive daytime somnolence.  Epworth sleepiness score: 12/24.  BMI: 31.4 kg/m  FINDINGS:   Total Record Time (hours, min): 8 H 16 min  Total Sleep Time (hours, min):  6 H 15 min   Percent REM (%):    15.84%   Calculated pAHI (per hour):  53.1      REM pAHI: 30.7     NREM pAHI: 57.3   Oxygen Saturation (%) Mean: 96  Minimum oxygen saturation (%):        82   O2 Saturation Range (%): 82-100  O2Saturation (minutes) <=88%: 1.6   Pulse Mean (bpm):    53  Pulse Range (53-88)   IMPRESSION: OSA (obstructive sleep apnea), severe   RECOMMENDATION:  This home sleep test demonstrates severe obstructive sleep apnea with a total AHI of 53.1/hour and O2 nadir of 82%.  Mild to moderate snoring was noted.  Treatment with positive airway pressure is recommended. The patient will be advised to proceed with an autoPAP titration/trial at home for now. A full night titration study may be considered to optimize treatment settings, if needed down the road.  Alternative treatment options may be limited secondary to the severity of his sleep disordered breathing.  Weight loss should be pursued.  Please note, that untreated obstructive sleep apnea may carry additional perioperative morbidity. Patients with significant obstructive sleep apnea should receive perioperative PAP therapy and the surgeons and particularly the anesthesiologist should be informed of the diagnosis and the severity of the sleep disordered breathing. The patient should be cautioned not to  drive, work at heights, or operate dangerous or heavy equipment when tired or sleepy. Review and reiteration of good sleep hygiene measures should be pursued with any patient. Other causes of the patient's symptoms, including circadian rhythm disturbances, an underlying mood disorder, medication effect and/or an underlying medical problem cannot be ruled out based on this test. Clinical correlation is recommended. The patient and his referring provider will be notified of the test results. The patient will be seen in follow up in sleep clinic at [facility].  I certify that I have reviewed the raw data recording prior to the issuance of this report in accordance with the standards of the American Academy of Sleep Medicine (AASM).  INTERPRETING PHYSICIAN:  Huston Foley, MD, PhD  Board Certified in Neurology and Sleep Medicine [facility] [address] [place], [place] [address] [phone]

## 2020-09-01 NOTE — Progress Notes (Signed)
Patient referred by Dr. Renne Crigler, seen by me on 07/13/20, HST on 08/28/20.    Please call and notify the patient that the recent home sleep test showed obstructive sleep apnea in the severe range. I recommend treatment in the form of autoPAP, which means, that we don't have to bring him in for a sleep study with CPAP, but will let him start using/try an autoPAP machine at home, through a DME company (of his choice, or as per insurance requirement). The DME representative will educate him on how to use the machine, how to put the mask on, etc. I have placed an order in the chart. Please send referral, talk to patient, send report to referring MD. We will need a FU in sleep clinic for 10 weeks post-PAP set up, please arrange that with me or one of our NPs. Thanks,   Huston Foley, MD, PhD [facility])

## 2020-09-01 NOTE — Addendum Note (Signed)
Addended by: Huston Foley on: 09/01/2020 09:53 AM   Modules accepted: Orders

## 2020-09-01 NOTE — Telephone Encounter (Signed)
Called patient to discuss sleep study results. No answer at this time. LVM for the patient to call back.   

## 2020-09-01 NOTE — Telephone Encounter (Signed)
-----   Message from Huston Foley, MD sent at 09/01/2020  9:53 AM EST ----- Patient referred by Dr. Renne Crigler, seen by me on 07/13/20, HST on 08/28/20.    Please call and notify the patient that the recent home sleep test showed obstructive sleep apnea in the severe range. I recommend treatment in the form of autoPAP, which means, that we don't have to bring him in for a sleep study with CPAP, but will let him start using/try an autoPAP machine at home, through a DME company (of his choice, or as per insurance requirement). The DME representative will educate him on how to use the machine, how to put the mask on, etc. I have placed an order in the chart. Please send referral, talk to patient, send report to referring MD. We will need a FU in sleep clinic for 10 weeks post-PAP set up, please arrange that with me or one of our NPs. Thanks,   Huston Foley, MD, PhD [facility])

## 2020-09-04 NOTE — Telephone Encounter (Signed)
I called pt. I advised pt that Dr. Frances Furbish reviewed their sleep study results and found that pt has severe osa. Dr. Frances Furbish recommends that pt start an auto pap at home. I reviewed PAP compliance expectations with the pt. Pt is agreeable to starting an auto-PAP. I advised pt that an order will be sent to a DME, [facility], and [facility] will call the pt within about one week after they file with the pt's insurance. [facility] will show the pt how to use the machine, fit for masks, and troubleshoot the auto-PAP if needed. A follow up appt was made for insurance purposes with Dr. Frances Furbish on 12/26/20 at 9:30am. Pt verbalized understanding to arrive 15 minutes early and bring their auto-PAP. A letter with all of this information in it will be mailed to the pt as a reminder. I verified with the pt that the address we have on file is correct. Pt verbalized understanding of results. Pt had no questions at this time but was encouraged to call back if questions arise. I have sent the order to [facility] and have received confirmation that they have received the order.

## 2020-09-12 DIAGNOSIS — E785 Hyperlipidemia, unspecified: Secondary | ICD-10-CM | POA: Diagnosis not present

## 2020-09-13 DIAGNOSIS — H25812 Combined forms of age-related cataract, left eye: Secondary | ICD-10-CM | POA: Diagnosis not present

## 2020-09-14 DIAGNOSIS — G4733 Obstructive sleep apnea (adult) (pediatric): Secondary | ICD-10-CM | POA: Diagnosis not present

## 2020-09-14 DIAGNOSIS — E785 Hyperlipidemia, unspecified: Secondary | ICD-10-CM | POA: Diagnosis not present

## 2020-09-14 DIAGNOSIS — I10 Essential (primary) hypertension: Secondary | ICD-10-CM | POA: Diagnosis not present

## 2020-09-14 DIAGNOSIS — Z6832 Body mass index (BMI) 32.0-32.9, adult: Secondary | ICD-10-CM | POA: Diagnosis not present

## 2020-10-04 DIAGNOSIS — Z20828 Contact with and (suspected) exposure to other viral communicable diseases: Secondary | ICD-10-CM | POA: Diagnosis not present

## 2020-10-31 DIAGNOSIS — L400 Psoriasis vulgaris: Secondary | ICD-10-CM | POA: Diagnosis not present

## 2020-11-02 DIAGNOSIS — G4733 Obstructive sleep apnea (adult) (pediatric): Secondary | ICD-10-CM | POA: Diagnosis not present

## 2020-11-30 DIAGNOSIS — G4733 Obstructive sleep apnea (adult) (pediatric): Secondary | ICD-10-CM | POA: Diagnosis not present

## 2020-12-26 ENCOUNTER — Encounter: Payer: Self-pay | Admitting: Neurology

## 2020-12-26 ENCOUNTER — Ambulatory Visit: Payer: BC Managed Care – PPO | Admitting: Neurology

## 2020-12-26 ENCOUNTER — Other Ambulatory Visit: Payer: Self-pay

## 2020-12-26 VITALS — BP 133/80 | HR 49 | Ht 72.0 in | Wt 231.0 lb

## 2020-12-26 DIAGNOSIS — R001 Bradycardia, unspecified: Secondary | ICD-10-CM | POA: Diagnosis not present

## 2020-12-26 DIAGNOSIS — G4733 Obstructive sleep apnea (adult) (pediatric): Secondary | ICD-10-CM

## 2020-12-26 DIAGNOSIS — M79671 Pain in right foot: Secondary | ICD-10-CM

## 2020-12-26 DIAGNOSIS — Z9989 Dependence on other enabling machines and devices: Secondary | ICD-10-CM

## 2020-12-26 DIAGNOSIS — M79672 Pain in left foot: Secondary | ICD-10-CM

## 2020-12-26 NOTE — Patient Instructions (Addendum)
It was good to see you again today. I am glad to hear that your AutoPap is working well for you.  You are fully compliant with treatment.  As discussed, you do have residual sleep apnea events and would benefit from an increase in your pressure.  To that end, I will increase your maximum pressure from 14 cm to 16 cm at this time.  As discussed, please call in 1 month or email through MyChart so we can review your 1 month download on the new pressure settings.  We will call you or email you back with a report/feedback.  As far as your low heart rate, which we call bradycardia, you currently have no symptoms.  Please make an appointment with your primary care physician to discuss further evaluation of your bradycardia.  As far as your foot pain, please also talk to him about your vitamin B12 deficiency and getting repeat blood work done. I would be happy to see you if Dr. Renne Crigler recommends evaluation through our office.  You may also benefit from evaluation through a vascular specialist or an ultrasound for your circulation.  ome of your symptoms including discoloration and pain with activity may reflect circulatory problems.  Please follow-up in sleep clinic to see one of our nurse practitioners routinely in 1 year.

## 2020-12-26 NOTE — Progress Notes (Signed)
Subjective:    Patient ID: Jeffrey Luna is a 65 y.o. male.  HPI     Interim history:   Mr. Jeffrey Luna is a 65 year old right-handed gentleman with an underlying medical history of hypertension, vitamin B12 deficiency, edema, psoriasis, allergic rhinitis, renal artery stenosis, hyperlipidemia, reflux disease, neuropathy and obesity, who presents for follow-up consultation of his obstructive sleep apnea after interim testing and starting AutoPap therapy.  The patient is accompanied by his wife today.  I first met him at the request of his primary care physician on 07/13/2020, at which time he reported snoring and excessive daytime somnolence.  He was advised to proceed with sleep testing.  He had a home sleep test on 08/28/2020 which indicated severe obstructive sleep apnea with an AHI of 53.1/h, O2 nadir of 82%.  Mild to moderate snoring was noted.  He was advised to start AutoPap therapy.  His set up date was 11/02/2020.   Today, 12/26/2020: I reviewed his AutoPap compliance data from 11/25/2020 through 12/24/2020, which is a total of 30 days, during which time he used his machine every night with percent use days greater than 4 hours at 100%, indicating superb compliance with an average usage of 6 hours and 29 minutes, residual AHI elevated at 15.5/h with primarily obstructive events, 95th percentile of pressure at 13.7 cm with a range of 7 to 14 cm with EPR.  Leak on the higher side with a 95th percentile at 19.5 L/min but leak has improved slightly in the recent past per breakdown on a day-to-day basis on his download.  He reports doing well with his AutoPap.  Initially, he had trouble adjusting to the pressure but now he feels well.  He is using a full facemask, he is a mouth breather.  He does have dry mouth which is his biggest complaints but overall he feels improved and is highly motivated to continue with treatment.  His wife has noticed residual breathing pauses while he is asleep.  He is agreeable to  trying a higher pressure.  He reports a longstanding history of foot pain.  He has been on Lyrica which has been helpful.  He has a history of B12 deficiency and had injections monthly before.  He is going to talk to his primary care physician about a referral for evaluation of neuropathy through our office.  He has not had a evaluation through vascular surgery, reports discoloration of his feet and swelling of his lower extremities despite taking 2 diuretics and pain with activity.  Pain subsides when he rests.  The patient's allergies, current medications, family history, past medical history, past social history, past surgical history and problem list were reviewed and updated as appropriate.   Previously:   07/13/20: (He) reports snoring and excessive daytime somnolence.  I reviewed your office note from 06/06/2020.  He has been on modafinil.  His duloxetine was recently reduced. In fact, he stopped taking it.  Of note, he also takes Lyrica 150 mg twice daily.  He has been on Lyrica for several years.  Modafinil is 200 mg once daily.  His Epworth sleepiness score is 12 out of 24, fatigue severity score is 30 out of 63.  He is married and lives with his wife, they have 2 grown children.  He works as a Theme park manager.  They have 1 cat in the household.  Bedtime is generally around 11 but he often falls asleep before then.  He sleeps in a recliner.  He has done so for  the past 2 or 3 years.  Rise time is between 530 and 6 AM.  He has nocturia about 2-3 times per average night and has had the occasional morning headache.  He has become sleepy at the wheel at times but has not fallen asleep at the wheel.  He has had a 40 pound weight gain over the past 5 years approximately.  He quit smoking some 25 to 30 years ago, drinks no alcohol, caffeine in the form of soda, 1/day on average.    His Past Medical History Is Significant For: Past Medical History:  Diagnosis Date  . High blood pressure   . Neuropathy   .  Snoring     His Past Surgical History Is Significant For: Past Surgical History:  Procedure Laterality Date  . NASAL SINUS SURGERY      His Family History Is Significant For: Family History  Problem Relation Age of Onset  . Dementia Mother   . Heart attack Father     His Social History Is Significant For: Social History   Socioeconomic History  . Marital status: Married    Spouse name: Not on file  . Number of children: Not on file  . Years of education: Not on file  . Highest education level: Not on file  Occupational History  . Not on file  Tobacco Use  . Smoking status: Former Smoker    Quit date: 2010    Years since quitting: 12.2  . Smokeless tobacco: Never Used  Substance and Sexual Activity  . Alcohol use: Never  . Drug use: Never  . Sexual activity: Not on file  Other Topics Concern  . Not on file  Social History Narrative  . Not on file   Social Determinants of Health   Financial Resource Strain: Not on file  Food Insecurity: Not on file  Transportation Needs: Not on file  Physical Activity: Not on file  Stress: Not on file  Social Connections: Not on file    His Allergies Are:  Allergies  Allergen Reactions  . Prozac [Fluoxetine]     Heart Rate increased   :   His Current Medications Are:  Outpatient Encounter Medications as of 12/26/2020  Medication Sig  . amLODipine (NORVASC) 5 MG tablet Take 5 mg by mouth daily.  . Multiple Vitamins-Minerals (PRESERVISION AREDS PO) Take by mouth.  . Multiple Vitamins-Minerals (ZINC PO) Take by mouth.  . pantoprazole (PROTONIX) 40 MG tablet Take 40 mg by mouth 2 (two) times daily.  . pregabalin (LYRICA) 150 MG capsule Take 1 capsule by mouth 2 (two) times daily.  . RESTASIS 0.05 % ophthalmic emulsion   . SENNA CO by Combination route.  . triamterene-hydrochlorothiazide (MAXZIDE-25) 37.5-25 MG tablet Take 1 tablet by mouth every morning.  Marland Kitchen atorvastatin (LIPITOR) 40 MG tablet Take 1 tablet by mouth daily.   . [DISCONTINUED] Ascorbic Acid (VITAMIN C PO) Take by mouth. (Patient not taking: Reported on 12/26/2020)  . [DISCONTINUED] modafinil (PROVIGIL) 100 MG tablet Take 100 mg by mouth daily. (Patient not taking: Reported on 12/26/2020)   No facility-administered encounter medications on file as of 12/26/2020.  :  Review of Systems:  Out of a complete 14 point review of systems, all are reviewed and negative with the exception of these symptoms as listed below: Review of Systems  Neurological:       Here for f/u on autopap. Reports he has been doing well. No issues to report    Objective:  Neurological Exam  Physical Exam Physical Examination:   Vitals:   12/26/20 0949  BP: 133/80  Pulse: (!) 49    General Examination: The patient is a very pleasant 65 y.o. male in no acute distress. He appears well-developed and well-nourished and well groomed.   HEENT: Normocephalic, atraumatic, pupils are equal, round and reactive to light, extraocular tracking is good without limitation to gaze excursion or nystagmus noted.  Corrective eyeglasses in place.  Hearing is grossly intact. Face is symmetric with normal facial animation. Speech is clear with no dysarthria noted. There is no hypophonia. There is no lip, neck/head, jaw or voice tremor. Neck is supple with full range of passive and active motion. There are no carotid bruits on auscultation. Oropharynx exam reveals: moderate mouth dryness, adequate dental hygiene and mild airway crowding.  Tongue protrudes centrally in palate elevates symmetrically.   Chest: Clear to auscultation without wheezing, rhonchi or crackles noted.  Heart: S1+S2+0, regular, bradycardia noted, no symptoms.   Abdomen: Soft, non-tender and non-distended.  Extremities: There is trace  to 1+ pitting edema in the distal lower extremities bilaterally.   Skin: Warm and dry with evidence of psoriatic plaques noted as well as mildly discolored feet with purplish  discoloration.    Musculoskeletal: exam reveals no obvious joint deformities, tenderness or joint swelling or erythema.   Neurologically:  Mental status: The patient is awake, alert and oriented in all 4 spheres. His immediate and remote memory, attention, language skills and fund of knowledge are appropriate. There is no evidence of aphasia, agnosia, apraxia or anomia. Speech is clear with normal prosody and enunciation. Thought process is linear. Mood is normal and affect is normal.  Cranial nerves II - XII are as described above under HEENT exam.  Motor exam: Normal bulk, strength and tone is noted. There is no tremor, fine motor skills and coordination: grossly intact.  Cerebellar testing: No dysmetria or intention tremor. There is no truncal or gait ataxia.  Sensory exam: intact to light touch in the upper and lower extremities.  Gait, station and balance: He stands without difficulty.  He has no obvious limp while walking, reports no significant foot pain currently.    Assessment and plan:  In summary, Esaiah Wanless is a very pleasant 65 year old male  with an underlying medical history of hypertension, vitamin B12 deficiency, edema, psoriasis, allergic rhinitis, renal artery stenosis, hyperlipidemia, reflux disease, neuropathy and obesity, who presents for follow-up consultation of his severe obstructive sleep apnea as determined by his home sleep test on 08/28/2020.  He has established treatment with AutoPap therapy since 11/02/2020.  He is fully compliant with treatment and commended for his treatment adherence.  He is benefiting from treatment and feels that he has had better sleep consolidation and sleep quality, better daytime symptoms.  Nevertheless, he has a somewhat suboptimal residual AHI, primarily due to residual obstructive events.  I would like to increase his maximum AutoPap pressure from 14 cm to 16 cm at this time.  He is agreeable.  We will review his download in about a month.   He is doing well from the sleep apnea standpoint and is advised to routinely follow-up in this clinic to see one of our nurse practitioners in 1 year.  He is advised to make an appointment with his primary care physician to discuss bradycardia and further work-up.  He is currently asymptomatic.  Of note, he has discontinued Provigil.  For his foot pain, he is encouraged to talk to his primary care  about further evaluation, and recheck on his B12 level.  Per wife, his latest A1c was less than 6.0, around 5.6 per her recollection.  Some of his symptoms may point towards circulatory problems including lower extremity swelling and discoloration.  He also has pain with activity and pain subsides when resting.  Intermittent claudication is not excluded.  He may benefit from work-up through a vascular specialist or at least evaluation through an ultrasound.  He is encouraged to discuss this with Dr. Shelia Media.  He is advised to call us or email [place] through [place] in about a month so we can review a download and we will call or email back with feedback.  I answered all the questions today and the patient and his wife are in agreement with the plan.   I spent 35 minutes in total face-to-face time and in reviewing records during pre-charting, more than 50% of which was spent in counseling and coordination of care, reviewing test results, reviewing medications and treatment regimen and/or in discussing or reviewing the diagnosis of OSA, foot pain, bradycardia, the prognosis and treatment options. Pertinent laboratory and imaging test results that were available during this visit with the patient were reviewed by me and considered in my medical decision making (see chart for details).

## 2020-12-28 DIAGNOSIS — M792 Neuralgia and neuritis, unspecified: Secondary | ICD-10-CM | POA: Diagnosis not present

## 2020-12-28 DIAGNOSIS — R7303 Prediabetes: Secondary | ICD-10-CM | POA: Diagnosis not present

## 2020-12-28 DIAGNOSIS — E538 Deficiency of other specified B group vitamins: Secondary | ICD-10-CM | POA: Diagnosis not present

## 2020-12-31 DIAGNOSIS — G4733 Obstructive sleep apnea (adult) (pediatric): Secondary | ICD-10-CM | POA: Diagnosis not present

## 2021-01-28 ENCOUNTER — Encounter: Payer: Self-pay | Admitting: Neurology

## 2021-01-29 ENCOUNTER — Telehealth: Payer: Self-pay | Admitting: Neurology

## 2021-01-29 NOTE — Telephone Encounter (Signed)
I called the Jeffrey Luna and advised I have received his call and will have Dr. Frances Furbish review once she returns to the office tomorrow. Jeffrey Luna was agreeable.

## 2021-01-29 NOTE — Telephone Encounter (Signed)
Pt called and LVM wanting to inform the Provider how it worked out for him with his CPAP since the increase of pressure. Pt would like a call back to discuss.

## 2021-01-30 DIAGNOSIS — G4733 Obstructive sleep apnea (adult) (pediatric): Secondary | ICD-10-CM | POA: Diagnosis not present

## 2021-02-06 ENCOUNTER — Telehealth: Payer: Self-pay | Admitting: Neurology

## 2021-02-06 DIAGNOSIS — G4733 Obstructive sleep apnea (adult) (pediatric): Secondary | ICD-10-CM

## 2021-02-06 NOTE — Telephone Encounter (Signed)
I attempted to call the pt and discuss recommendation. Pt was not available and vm was not working. Will try again at a later time.

## 2021-02-06 NOTE — Telephone Encounter (Signed)
I reviewed patient's recent compliance data for the month of April.  He is fully compliant with treatment, average usage of 5 hours and 55 minutes, residual AHI still elevated but slightly better at 14.2/h since we increased the maximum pressure to 16 cm from 14 cm.  Average pressure for the 95th percentile at 15.6 cm, leak acceptable with a 95th percentile at 15 L/min.  Please reach out to patient either via MyChart or by phone call, he is commended for his treatment adherence.  I  would like to see if we can increase the pressure settings for residual sleep apnea.  I would like to try him on a minimum pressure of 8 cm, maximum of 18 cm.  I placed an order change, we can send the order to his DME company.  We can do another remote review of his compliance data in about 2 months.

## 2021-02-07 NOTE — Telephone Encounter (Signed)
I attempted to call the pt and discuss recommendation. Pt was not available and vm was not working. Will try again at a later time.  

## 2021-02-08 NOTE — Telephone Encounter (Signed)
I called patient, and we discussed Dr. Teofilo Pod recommendation. Patient is agreeable to changing pressure to a minimum pressure of 8 and a maximum pressure of 18.  I have changed the prescription for the AutoPap in [facility], and patient will call back in about 2 months so we can reevaluate his data and see if further changes need to be made.

## 2021-02-08 NOTE — Telephone Encounter (Signed)
Noted, thank you

## 2021-02-19 DIAGNOSIS — H6121 Impacted cerumen, right ear: Secondary | ICD-10-CM | POA: Diagnosis not present

## 2021-02-19 DIAGNOSIS — H6122 Impacted cerumen, left ear: Secondary | ICD-10-CM | POA: Diagnosis not present

## 2021-03-28 DIAGNOSIS — H40053 Ocular hypertension, bilateral: Secondary | ICD-10-CM | POA: Diagnosis not present

## 2021-04-05 DIAGNOSIS — L400 Psoriasis vulgaris: Secondary | ICD-10-CM | POA: Diagnosis not present

## 2021-04-19 ENCOUNTER — Institutional Professional Consult (permissible substitution): Payer: BC Managed Care – PPO | Admitting: Neurology

## 2021-05-03 DIAGNOSIS — L400 Psoriasis vulgaris: Secondary | ICD-10-CM | POA: Diagnosis not present

## 2021-05-15 ENCOUNTER — Other Ambulatory Visit: Payer: Self-pay

## 2021-05-15 ENCOUNTER — Ambulatory Visit: Payer: BC Managed Care – PPO | Admitting: Neurology

## 2021-05-15 ENCOUNTER — Encounter: Payer: Self-pay | Admitting: Neurology

## 2021-05-15 VITALS — BP 124/70 | HR 59 | Ht 72.0 in | Wt 240.6 lb

## 2021-05-15 DIAGNOSIS — Z9989 Dependence on other enabling machines and devices: Secondary | ICD-10-CM

## 2021-05-15 DIAGNOSIS — G629 Polyneuropathy, unspecified: Secondary | ICD-10-CM | POA: Diagnosis not present

## 2021-05-15 DIAGNOSIS — G4733 Obstructive sleep apnea (adult) (pediatric): Secondary | ICD-10-CM | POA: Diagnosis not present

## 2021-05-15 DIAGNOSIS — M79672 Pain in left foot: Secondary | ICD-10-CM | POA: Diagnosis not present

## 2021-05-15 DIAGNOSIS — M79671 Pain in right foot: Secondary | ICD-10-CM | POA: Diagnosis not present

## 2021-05-15 DIAGNOSIS — Z789 Other specified health status: Secondary | ICD-10-CM

## 2021-05-15 NOTE — Progress Notes (Signed)
Subjective:    Patient ID: Reubin Bushnell is a 65 y.o. male.  HPI    Star Age, MD, PhD [facility] [address], [address] [place], [place] [address]  Dear Dr. Shelia Media,   I saw your patient, Denard Tuminello, upon your kind request in my neurologic clinic today for evaluation of his neuropathy.  The patient is unaccompanied today.  I have followed him for sleep apnea management and last saw him in March 2022.  As you know, Mr. Vinzant is a 65 year old right-handed gentleman with an underlying medical history of hypertension, vitamin B12 deficiency, edema, renal artery stenosis, psoriasis, hyperlipidemia, reflux disease, obesity, pre-diabetes, and obstructive sleep apnea, on treatment with AutoPap therapy, who reports an approximately 6-year history of numbness affecting both feet, particularly the toes and also pain when walking or standing for prolonged period of time.  Pain seems to be confined to the toes.  It used to be when active but pain has progressed.  He has been on the Lyrica.  It is currently at 150 mg 3 times daily.  Numbness affects particularly the forefeet areas, he has not fallen thankfully.  He has not had any upper body symptoms.  His father was diagnosed with neuropathy later in life.  He had diabetes as well.  His symptoms manifested in his 67s however.  He is currently on oral B12 supplementation but in the past had monthly injections.  He is trying to hydrate better and working on lifestyle changes.  Smoking some 20 years ago.  He does not currently drink any alcohol, he likes to drink sweet tea and soda and is working on reducing those.  He is working on weight loss.  His AutoPap is going well, he sleeps very well with that and endorses full compliance but leak bothers him from time to time.  He is a mouth breather and has a traditionally shaped fullface mask, he has had difficulty with seal as he gets a lot of moisture around his face.  The  humidity setting was reduced by his DME representative but he ends up alternating between 2 masks in the middle of the night exchanging the moist mask with a dryer 1.  He would be willing to try a different style as he does have a mark on his nasal bridge.  He is very motivated to continue with his AutoPap, feels like he cannot sleep without it now.  I reviewed your office note from 12/28/20. He had blood work at the time including A1c which was 6.0, B12 which was 396.    The patient's allergies, current medications, family history, past medical history, past social history, past surgical history and problem list were reviewed and updated as appropriate.    Previously:   12/26/2020: 65 year old right-handed gentleman with an underlying medical history of hypertension, vitamin B12 deficiency, edema, psoriasis, allergic rhinitis, renal artery stenosis, hyperlipidemia, reflux disease, neuropathy and obesity, who presents for follow-up consultation of his obstructive sleep apnea after interim testing and starting AutoPap therapy.  The patient is accompanied by his wife today.  I first met him at the request of his primary care physician on 07/13/2020, at which time he reported snoring and excessive daytime somnolence.  He was advised to proceed with sleep testing.  He had a home sleep test on 08/28/2020 which indicated severe obstructive sleep apnea with an AHI of 53.1/h, O2 nadir of 82%.  Mild to moderate snoring was noted.  He was advised to start  AutoPap therapy.  His set up date was 11/02/2020. I reviewed his AutoPap compliance data from 11/25/2020 through 12/24/2020, which is a total of 30 days, during which time he used his machine every night with percent use days greater than 4 hours at 100%, indicating superb compliance with an average usage of 6 hours and 29 minutes, residual AHI elevated at 15.5/h with primarily obstructive events, 95th percentile of pressure at 13.7 cm with a range of 7 to 14 cm with EPR.   Leak on the higher side with a 95th percentile at 19.5 L/min but leak has improved slightly in the recent past per breakdown on a day-to-day basis on his download.  He reports doing well with his AutoPap.  Initially, he had trouble adjusting to the pressure but now he feels well.  He is using a full facemask, he is a mouth breather.  He does have dry mouth which is his biggest complaints but overall he feels improved and is highly motivated to continue with treatment.  His wife has noticed residual breathing pauses while he is asleep.  He is agreeable to trying a higher pressure.  He reports a longstanding history of foot pain.  He has been on Lyrica which has been helpful.  He has a history of B12 deficiency and had injections monthly before.  He is going to talk to his primary care physician about a referral for evaluation of neuropathy through our office.  He has not had a evaluation through vascular surgery, reports discoloration of his feet and swelling of his lower extremities despite taking 2 diuretics and pain with activity.  Pain subsides when he rests.   07/13/20: (He) reports snoring and excessive daytime somnolence.  I reviewed your office note from 06/06/2020.  He has been on modafinil.  His duloxetine was recently reduced. In fact, he stopped taking it.  Of note, he also takes Lyrica 150 mg twice daily.  He has been on Lyrica for several years.  Modafinil is 200 mg once daily.  His Epworth sleepiness score is 12 out of 24, fatigue severity score is 30 out of 63.  He is married and lives with his wife, they have 2 grown children.  He works as a Theme park manager.  They have 1 cat in the household.  Bedtime is generally around 11 but he often falls asleep before then.  He sleeps in a recliner.  He has done so for the past 2 or 3 years.  Rise time is between 530 and 6 AM.  He has nocturia about 2-3 times per average night and has had the occasional morning headache.  He has become sleepy at the wheel at times but  has not fallen asleep at the wheel.  He has had a 40 pound weight gain over the past 5 years approximately.  He quit smoking some 25 to 30 years ago, drinks no alcohol, caffeine in the form of soda, 1/day on average.  His Past Medical History Is Significant For: Past Medical History:  Diagnosis Date   High blood pressure    Neuropathy    Snoring     His Past Surgical History Is Significant For: Past Surgical History:  Procedure Laterality Date   NASAL SINUS SURGERY      His Family History Is Significant For: Family History  Problem Relation Age of Onset   Dementia Mother    Heart attack Father    Neuropathy Father     His Social History Is Significant For: Social  History   Socioeconomic History   Marital status: Married    Spouse name: Not on file   Number of children: Not on file   Years of education: Not on file   Highest education level: Not on file  Occupational History   Not on file  Tobacco Use   Smoking status: Former    Types: Cigarettes    Quit date: 2010    Years since quitting: 12.6   Smokeless tobacco: Never  Vaping Use   Vaping Use: Never used  Substance and Sexual Activity   Alcohol use: Never   Drug use: Never   Sexual activity: Not on file  Other Topics Concern   Not on file  Social History Narrative   Not on file   Social Determinants of Health   Financial Resource Strain: Not on file  Food Insecurity: Not on file  Transportation Needs: Not on file  Physical Activity: Not on file  Stress: Not on file  Social Connections: Not on file    His Allergies Are:  Allergies  Allergen Reactions   Prozac [Fluoxetine]     Heart Rate increased   :  His Current Medications Are:  Outpatient Encounter Medications as of 05/15/2021  Medication Sig   amLODipine (NORVASC) 5 MG tablet Take 5 mg by mouth daily.   atorvastatin (LIPITOR) 40 MG tablet Take 1 tablet by mouth daily.   Multiple Vitamins-Minerals (PRESERVISION AREDS PO) Take by mouth.    Multiple Vitamins-Minerals (ZINC PO) Take by mouth.   pantoprazole (PROTONIX) 40 MG tablet Take 40 mg by mouth 2 (two) times daily.   pregabalin (LYRICA) 150 MG capsule Take 1 capsule by mouth 2 (two) times daily.   RESTASIS 0.05 % ophthalmic emulsion    SENNA CO by Combination route.   triamterene-hydrochlorothiazide (MAXZIDE-25) 37.5-25 MG tablet Take 1 tablet by mouth every morning.   No facility-administered encounter medications on file as of 05/15/2021.  :   Review of Systems:  Out of a complete 14 point review of systems, all are reviewed and negative with the exception of these symptoms as listed below:  Review of Systems  Neurological:        Pt is here for bilateral neuropathy pain feet . Patient says his feet hurt a lot . Pt stated that the pain started 6 years ago but pain worsen over the years .   Objective:  Neurological Exam  Physical Exam Physical Examination:   Vitals:   05/15/21 0808  BP: 124/70  Pulse: (!) 59    General Examination: The patient is a very pleasant 65 y.o. male in no acute distress. He appears well-developed and well-nourished and well groomed.   HEENT: Normocephalic, atraumatic, pupils are equal, round and reactive to light, extraocular tracking is good without limitation to gaze excursion or nystagmus noted.  Corrective eyeglasses in place.  Hearing is grossly intact. Face is symmetric with normal facial animation and intact sensation to light touch, vibration and temperature. Speech is clear with no dysarthria noted. There is no hypophonia. There is no lip, neck/head, jaw or voice tremor. Neck is supple with full range of passive and active motion. There are no carotid bruits on auscultation. Oropharynx exam reveals: moderate mouth dryness, adequate dental hygiene and mild airway crowding.  Tongue protrudes centrally in palate elevates symmetrically.    Chest: Clear to auscultation without wheezing, rhonchi or crackles noted.   Heart: S1+S2+0,  regular, bradycardia noted, no symptoms.    Abdomen: Soft, non-tender and non-distended.  Extremities: There is trace  to 1+ pitting edema in the distal lower extremities bilaterally, up to lower shin areas.   Skin: Warm and dry with mild evidence of psoriatic plaques noted as well as mildly discolored feet with purplish discoloration.     Musculoskeletal: exam reveals no obvious joint deformities.    Neurologically:  Mental status: The patient is awake, alert and oriented in all 4 spheres. His immediate and remote memory, attention, language skills and fund of knowledge are appropriate. There is no evidence of aphasia, agnosia, apraxia or anomia. Speech is clear with normal prosody and enunciation. Thought process is linear. Mood is normal and affect is normal.  Cranial nerves II - XII are as described above under HEENT exam.  Motor exam: Normal bulk, strength and tone is noted. There is no resting or postural tremor, no drift, no rebound.  Romberg is negative.  Fine motor skills and coordination: intact with finger taps, hand movements, rapid alternating patting, foot taps and foot agility.  Reflexes are trace to 1+ in the upper extremities and diminished in both knees, absent in both ankles.  Toes are downgoing bilaterally. Cerebellar testing: No dysmetria or intention tremor, finger-to-nose and heel-to-shin unremarkable. There is no truncal or gait ataxia.  Sensory exam: intact to light touch in the upper and lower extremities, decreased sensation to pinprick in both lower extremities, left more so than the right in the forefoot area but up to the ankle, decreased temperature sense in the left foot, decreased vibration sense in both feet up to ankle bones. Gait, station and balance: He stands without difficulty.  He has no obvious limp while walking, reports no significant foot pain currently while walking, tandem walk is challenging but doable.  Assessment and plan:  In summary, Saxon Barich is a very pleasant 65 year old male with an underlying medical history of hypertension, vitamin B12 deficiency, edema, renal artery stenosis, psoriasis, hyperlipidemia, reflux disease, obesity, pre-diabetes, and obstructive sleep apnea, on treatment with AutoPap therapy, who presents for evaluation of his numbness and pain affecting both feet.  History and examination would support peripheral neuropathy.  He has been labeled as prediabetic.  Latest A1c several months ago was at 6.0.  He is advised that most of the time a treatable cause cannot be found unless it is diabetes associated or prediabetes associated which is the most common cause typically.  There are rare causes of neuropathy.  We will look for reversible causes by doing blood work today.  I would like to proceed with an EMG nerve conduction velocity test as well.  I explained this test to him in detail.  Of note, his home sleep test from 08/28/2020 indicated severe obstructive sleep apnea.  He has established treatment with AutoPap since early February 2022.  He is compliant with treatment and indicates good results.  I suggested he try an alternative style fullface mask.  I wrote a prescription for this.  He is commended on his treatment adherence.  For now, he is advised to continue with symptomatic treatment with Lyrica at the current dose.  He is agreeable to proceeding with additional blood work, we will look for thyroid dysfunction, inflammatory and autoimmune markers, check protein electrophoresis.  We will call him with his blood test results as well as EMG nerve conduction velocity test results and plan a follow-up accordingly.  He is advised to keep his current scheduled follow-up appointment with Vaughan Browner, NP.   Some of his symptoms may point towards  circulatory problems including lower extremity swelling and discoloration.  He is advised that swelling can be seen in patients who take amlodipine as well. He also has pain with  activity and pain subsides when resting.  Intermittent claudication is not excluded.  He may benefit from work-up through a vascular specialist or at least evaluation through an ultrasound.  He is encouraged to discuss this with you.  I answered all his questions today and he was in agreement with the plan. Thank you very much for allowing me to participate in the care of this nice patient. If I can be of any further assistance to you please do not hesitate to call me at [phone].  Sincerely,   Star Age, MD, PhD

## 2021-05-15 NOTE — Patient Instructions (Addendum)
It was nice to see you again today.  Your history and examination do suggest the possibility of neuropathy.   You may have a condition called peripheral neuropathy, i. e. nerve damage. Unfortunately, as I mentioned, there is no specific treatment for most neuropathies. The most common cause for neuropathy is diabetes in this [place], in which case, tight glucose control is key.  Some studies suggest that obesity and prediabetes can also cause nerve damage even in the absence of a formal diagnosis of diabetes.    Other causes include thyroid disease, and some vitamin deficiencies. Certain medications such as chemotherapy agents and other chemicals or toxins including alcohol can cause neuropathy. There are some genetic conditions or hereditary neuropathies. Typically patients will report a family history of neuropathy in those conditions. There are cases associated with cancers and autoimmune conditions. Most neuropathies are progressive unless a root cause can be found and treated, which is rare, as I explained. For most neuropathies there is no actual cure or reversing of symptoms. Painful neuropathy can be difficult to treat symptomatically, but there are some medications available to ease the symptoms.  Thankfully, you have no significant pain symptoms at this time and can be monitored for symptoms.    Electrophysiologic testing with nerve conduction velocity studies and EMG (muscle testing) do not always pick up neuropathies that affect the smallest fibers. Other common tests include different type of blood work, and rarely, spinal fluid testing, and sometimes we resort to asking for a nerve and muscle biopsy.  We can also consider specialist input from a neuromuscular specialist, sometimes we make referrals to [facility] or [facility] or [facility].  For now, as discussed, we will proceed with further work-up from my end of things: We will check blood work today and call you with the test  results.  We will do an EMG and nerve conduction velocity test, which is an electrical nerve and muscle test, which we will schedule. We will call you with the results.  As far as your sleep apnea, please continue to use your machine consistently.  I have placed an order for mask refit and the possibility of changing to an alternative full facemask.  For now, you can keep your appointment as scheduled with [facility].  We may make another follow-up appointment if the need arises in between.  We will definitely call you with the results on your electrical nerve and muscle test as well as blood work.

## 2021-05-17 ENCOUNTER — Encounter (INDEPENDENT_AMBULATORY_CARE_PROVIDER_SITE_OTHER): Payer: BC Managed Care – PPO | Admitting: Diagnostic Neuroimaging

## 2021-05-17 ENCOUNTER — Ambulatory Visit: Payer: BC Managed Care – PPO | Admitting: Diagnostic Neuroimaging

## 2021-05-17 ENCOUNTER — Other Ambulatory Visit: Payer: Self-pay

## 2021-05-17 DIAGNOSIS — Z789 Other specified health status: Secondary | ICD-10-CM

## 2021-05-17 DIAGNOSIS — M79671 Pain in right foot: Secondary | ICD-10-CM

## 2021-05-17 DIAGNOSIS — G4733 Obstructive sleep apnea (adult) (pediatric): Secondary | ICD-10-CM

## 2021-05-17 DIAGNOSIS — Z0289 Encounter for other administrative examinations: Secondary | ICD-10-CM

## 2021-05-17 DIAGNOSIS — G629 Polyneuropathy, unspecified: Secondary | ICD-10-CM | POA: Diagnosis not present

## 2021-05-17 DIAGNOSIS — M79672 Pain in left foot: Secondary | ICD-10-CM

## 2021-05-21 LAB — COMPREHENSIVE METABOLIC PANEL
ALT: 37 IU/L (ref 0–44)
AST: 30 IU/L (ref 0–40)
Albumin/Globulin Ratio: 2.1 (ref 1.2–2.2)
Albumin: 4.7 g/dL (ref 3.8–4.8)
Alkaline Phosphatase: 102 IU/L (ref 44–121)
BUN/Creatinine Ratio: 15 (ref 10–24)
BUN: 14 mg/dL (ref 8–27)
Bilirubin Total: 0.5 mg/dL (ref 0.0–1.2)
CO2: 24 mmol/L (ref 20–29)
Calcium: 9.9 mg/dL (ref 8.6–10.2)
Chloride: 103 mmol/L (ref 96–106)
Creatinine, Ser: 0.94 mg/dL (ref 0.76–1.27)
Globulin, Total: 2.2 g/dL (ref 1.5–4.5)
Glucose: 101 mg/dL — ABNORMAL HIGH (ref 65–99)
Potassium: 4.5 mmol/L (ref 3.5–5.2)
Sodium: 142 mmol/L (ref 134–144)
Total Protein: 6.9 g/dL (ref 6.0–8.5)
eGFR: 91 mL/min/{1.73_m2} (ref >59)

## 2021-05-21 LAB — SEDIMENTATION RATE: Sed Rate: 8 mm/hr (ref 0–30)

## 2021-05-21 LAB — ENA+DNA/DS+SJORGEN'S
ENA RNP Ab: 1.2 AI — ABNORMAL HIGH (ref 0.0–0.9)
ENA SM Ab Ser-aCnc: <0.2 AI (ref 0.0–0.9)
ENA SSA (RO) Ab: <0.2 AI (ref 0.0–0.9)
ENA SSB (LA) Ab: <0.2 AI (ref 0.0–0.9)
dsDNA Ab: 1 IU/mL (ref 0–9)

## 2021-05-21 LAB — MULTIPLE MYELOMA PANEL, SERUM
Albumin SerPl Elph-Mcnc: 3.8 g/dL (ref 2.9–4.4)
Albumin/Glob SerPl: 1.3 (ref 0.7–1.7)
Alpha 1: 0.2 g/dL (ref 0.0–0.4)
Alpha2 Glob SerPl Elph-Mcnc: 0.7 g/dL (ref 0.4–1.0)
B-Globulin SerPl Elph-Mcnc: 1.2 g/dL (ref 0.7–1.3)
Gamma Glob SerPl Elph-Mcnc: 0.9 g/dL (ref 0.4–1.8)
Globulin, Total: 3.1 g/dL (ref 2.2–3.9)
IgA/Immunoglobulin A, Serum: 213 mg/dL (ref 61–437)
IgG (Immunoglobin G), Serum: 910 mg/dL (ref 603–1613)
IgM (Immunoglobulin M), Srm: 43 mg/dL (ref 20–172)

## 2021-05-21 LAB — B12 AND FOLATE PANEL
Folate: 8.7 ng/mL (ref >3.0)
Vitamin B-12: >2000 pg/mL — ABNORMAL HIGH (ref 232–1245)

## 2021-05-21 LAB — VITAMIN B1: Thiamine: 123.5 nmol/L (ref 66.5–200.0)

## 2021-05-21 LAB — C-REACTIVE PROTEIN: CRP: 1 mg/L (ref 0–10)

## 2021-05-21 LAB — TSH: TSH: 3.43 u[IU]/mL (ref 0.450–4.500)

## 2021-05-21 LAB — ANA W/REFLEX: Anti Nuclear Antibody (ANA): POSITIVE — AB

## 2021-05-21 LAB — VITAMIN B6: Vitamin B6: 8.4 ug/L (ref 3.4–65.2)

## 2021-05-21 LAB — RPR: RPR Ser Ql: NONREACTIVE

## 2021-05-21 LAB — RHEUMATOID FACTOR: Rheumatoid fact SerPl-aCnc: <10 IU/mL (ref <14.0)

## 2021-05-21 LAB — VITAMIN D 25 HYDROXY (VIT D DEFICIENCY, FRACTURES): Vit D, 25-Hydroxy: 12 ng/mL — ABNORMAL LOW (ref 30.0–100.0)

## 2021-05-21 LAB — HGB A1C W/O EAG: Hgb A1c MFr Bld: 5.9 % — ABNORMAL HIGH (ref 4.8–5.6)

## 2021-05-22 NOTE — Procedures (Signed)
   [facility]  NCS (NERVE CONDUCTION STUDY) WITH EMG (ELECTROMYOGRAPHY) REPORT   STUDY DATE: 05/17/21 PATIENT NAME: Jeffrey Luna DOB: 06/08/1956 MRN: [id number]  ORDERING CLINICIAN: Huston Foley, MD PhD   TECHNOLOGIST: Durenda Age ELECTROMYOGRAPHER: Glenford Bayley. Pasqual Farias, MD  CLINICAL INFORMATION: 65 year old male with bilateral numbness and pain in the toes.  FINDINGS: NERVE CONDUCTION STUDY:  Bilateral tibial and left peroneal motor responses have normal distal latencies, decreased amplitudes and slow conduction velocities.  Bilateral sural and left superficial peroneal sensory responses could not be obtained.  Bilateral tibial F wave latencies are prolonged.    NEEDLE ELECTROMYOGRAPHY:  Needle examination of left lower extremity is normal.   IMPRESSION:   Abnormal study demonstrating: - Axonal sensorimotor polyneuropathy.    INTERPRETING PHYSICIAN:  Suanne Marker, MD Certified in Neurology, Neurophysiology and Neuroimaging  [facility] [address], [address] [place], [place] [address] [phone]  [facility]    Nerve / Sites Muscle Latency Ref. Amplitude Ref. Rel Amp Segments Distance Velocity Ref. Area    ms ms mV mV %  cm m/s m/s mVms  L Peroneal - EDB     Ankle EDB 4.0 ?6.5 1.7 ?2.0 100 Ankle - EDB 9   6.3     Fib head EDB 12.0  1.0  61.4 Fib head - Ankle 28 35 ?44 4.2     Pop fossa EDB 15.1  1.5  149 Pop fossa - Fib head 10 32 ?44 6.5         Pop fossa - Ankle      L Tibial - AH     Ankle AH 3.4 ?5.8 2.6 ?4.0 100 Ankle - AH 9   10.8     Pop fossa AH 16.0  1.0  37.6 Pop fossa - Ankle 41 32 ?41 6.4  R Tibial - AH     Ankle AH 4.0 ?5.8 3.5 ?4.0 100 Ankle - AH 9   15.0     Pop fossa AH 16.8  1.6  45.5 Pop fossa - Ankle 40 31 ?41 11.5           SNC    Nerve / Sites Rec. Site Peak Lat Ref.  Amp Ref. Segments Distance    ms ms V V  cm  L Sural - Ankle (Calf)     Calf Ankle NR ?4.4 NR ?6 Calf - Ankle 14  R Sural -  Ankle (Calf)     Calf Ankle NR ?4.4 NR ?6 Calf - Ankle 14  L Superficial peroneal - Ankle     Lat leg Ankle NR ?4.4 NR ?6 Lat leg - Ankle 14           F  Wave    Nerve F Lat Ref.   ms ms  L Tibial - AH 67.8 ?56.0  R Tibial - AH 68.5 ?56.0         EMG Summary Table    Spontaneous MUAP Recruitment  Muscle IA Fib PSW Fasc Other Amp Dur. Poly Pattern  L. Vastus medialis Normal None None None _______ Normal Normal Normal Normal  L. Tibialis anterior Normal None None None _______ Normal Normal Normal Normal  L. Gastrocnemius (Medial head) Normal None None None _______ Normal Normal Normal Normal

## 2021-05-23 ENCOUNTER — Telehealth: Payer: Self-pay | Admitting: *Deleted

## 2021-05-23 DIAGNOSIS — G629 Polyneuropathy, unspecified: Secondary | ICD-10-CM

## 2021-05-23 DIAGNOSIS — R768 Other specified abnormal immunological findings in serum: Secondary | ICD-10-CM

## 2021-05-23 NOTE — Telephone Encounter (Signed)
Spoke with the patient and discussed the EMG/NCV and lab results as noted in detail below by Dr Frances Furbish.  I discussed the low vitamin D and the ANA positive findings on labs.  The patient understands that although he reports taking an over-the-counter vitamin D supplement, his vitamin D is pretty low at 12.  He has been strongly advised to contact his primary care provider to discuss adding a prescription vitamin D supplement for the next few weeks.  I also let him know I would send the results to Dr. Renne Crigler.  The patient has agreed to see a rheumatologist to further investigate the ANA lab abnormality.  His questions were answered and he verbalized appreciation.  Lab results sent to Dr Renne Crigler. Order placed for rheumatology referral.

## 2021-05-23 NOTE — Telephone Encounter (Signed)
-----   Message from Huston Foley, MD sent at 05/22/2021  4:33 PM EDT ----- Please advise patient that his electrical nerve and muscle test showed evidence of neuropathy.  Findings are overall on the milder side.  His blood work showed some abnormalities including low vitamin D.  I recommend that he start an over-the-counter vitamin D supplement, around 2000 units/day but talk to his primary care about adding prescription vitamin D for the next several weeks.  One of his autoimmune screening test called ANA was positive, this can be seen in a variety of rheumatological diseases including rheumatoid arthritis and lupus.  Some rheumatological conditions can also increase the risk for neuropathy.  I would like to get input from her rheumatologist and make a referral.  If he is agreeable, please place a referral for rheumatology with positive ANA and neuropathy as indications.

## 2021-05-30 DIAGNOSIS — R768 Other specified abnormal immunological findings in serum: Secondary | ICD-10-CM | POA: Diagnosis not present

## 2021-05-30 DIAGNOSIS — M79641 Pain in right hand: Secondary | ICD-10-CM | POA: Diagnosis not present

## 2021-05-30 DIAGNOSIS — M1990 Unspecified osteoarthritis, unspecified site: Secondary | ICD-10-CM | POA: Diagnosis not present

## 2021-05-30 DIAGNOSIS — M79642 Pain in left hand: Secondary | ICD-10-CM | POA: Diagnosis not present

## 2021-05-30 DIAGNOSIS — L4050 Arthropathic psoriasis, unspecified: Secondary | ICD-10-CM | POA: Diagnosis not present

## 2021-05-30 DIAGNOSIS — M359 Systemic involvement of connective tissue, unspecified: Secondary | ICD-10-CM | POA: Diagnosis not present

## 2021-05-30 NOTE — Progress Notes (Addendum)
CM sent [facility] for new cpap orders. SY Ross Ludwig, RN got it!

## 2021-06-07 DIAGNOSIS — G4733 Obstructive sleep apnea (adult) (pediatric): Secondary | ICD-10-CM | POA: Diagnosis not present

## 2021-06-13 DIAGNOSIS — M359 Systemic involvement of connective tissue, unspecified: Secondary | ICD-10-CM | POA: Diagnosis not present

## 2021-06-13 DIAGNOSIS — M25512 Pain in left shoulder: Secondary | ICD-10-CM | POA: Diagnosis not present

## 2021-06-13 DIAGNOSIS — M1990 Unspecified osteoarthritis, unspecified site: Secondary | ICD-10-CM | POA: Diagnosis not present

## 2021-06-13 DIAGNOSIS — M25572 Pain in left ankle and joints of left foot: Secondary | ICD-10-CM | POA: Diagnosis not present

## 2021-06-13 DIAGNOSIS — M79672 Pain in left foot: Secondary | ICD-10-CM | POA: Diagnosis not present

## 2021-06-13 DIAGNOSIS — M549 Dorsalgia, unspecified: Secondary | ICD-10-CM | POA: Diagnosis not present

## 2021-06-13 DIAGNOSIS — M542 Cervicalgia: Secondary | ICD-10-CM | POA: Diagnosis not present

## 2021-06-13 DIAGNOSIS — M25511 Pain in right shoulder: Secondary | ICD-10-CM | POA: Diagnosis not present

## 2021-06-13 DIAGNOSIS — M79671 Pain in right foot: Secondary | ICD-10-CM | POA: Diagnosis not present

## 2021-06-13 DIAGNOSIS — L4050 Arthropathic psoriasis, unspecified: Secondary | ICD-10-CM | POA: Diagnosis not present

## 2021-06-13 DIAGNOSIS — M25571 Pain in right ankle and joints of right foot: Secondary | ICD-10-CM | POA: Diagnosis not present

## 2021-06-18 ENCOUNTER — Ambulatory Visit: Payer: BC Managed Care – PPO | Admitting: Internal Medicine

## 2021-06-19 DIAGNOSIS — G4733 Obstructive sleep apnea (adult) (pediatric): Secondary | ICD-10-CM | POA: Diagnosis not present

## 2021-07-10 DIAGNOSIS — Z125 Encounter for screening for malignant neoplasm of prostate: Secondary | ICD-10-CM | POA: Diagnosis not present

## 2021-07-10 DIAGNOSIS — E538 Deficiency of other specified B group vitamins: Secondary | ICD-10-CM | POA: Diagnosis not present

## 2021-07-10 DIAGNOSIS — Z0000 Encounter for general adult medical examination without abnormal findings: Secondary | ICD-10-CM | POA: Diagnosis not present

## 2021-07-10 DIAGNOSIS — R7303 Prediabetes: Secondary | ICD-10-CM | POA: Diagnosis not present

## 2021-07-16 DIAGNOSIS — Z1212 Encounter for screening for malignant neoplasm of rectum: Secondary | ICD-10-CM | POA: Diagnosis not present

## 2021-07-16 DIAGNOSIS — G629 Polyneuropathy, unspecified: Secondary | ICD-10-CM | POA: Diagnosis not present

## 2021-07-16 DIAGNOSIS — I10 Essential (primary) hypertension: Secondary | ICD-10-CM | POA: Diagnosis not present

## 2021-07-16 DIAGNOSIS — Z23 Encounter for immunization: Secondary | ICD-10-CM | POA: Diagnosis not present

## 2021-07-16 DIAGNOSIS — Z0000 Encounter for general adult medical examination without abnormal findings: Secondary | ICD-10-CM | POA: Diagnosis not present

## 2021-07-16 DIAGNOSIS — I701 Atherosclerosis of renal artery: Secondary | ICD-10-CM | POA: Diagnosis not present

## 2021-07-16 DIAGNOSIS — J302 Other seasonal allergic rhinitis: Secondary | ICD-10-CM | POA: Diagnosis not present

## 2021-07-19 DIAGNOSIS — G4733 Obstructive sleep apnea (adult) (pediatric): Secondary | ICD-10-CM | POA: Diagnosis not present

## 2021-07-23 DIAGNOSIS — R051 Acute cough: Secondary | ICD-10-CM | POA: Diagnosis not present

## 2021-07-23 DIAGNOSIS — M791 Myalgia, unspecified site: Secondary | ICD-10-CM | POA: Diagnosis not present

## 2021-07-23 DIAGNOSIS — G4733 Obstructive sleep apnea (adult) (pediatric): Secondary | ICD-10-CM | POA: Diagnosis not present

## 2021-07-23 DIAGNOSIS — J069 Acute upper respiratory infection, unspecified: Secondary | ICD-10-CM | POA: Diagnosis not present

## 2021-07-23 DIAGNOSIS — Z20828 Contact with and (suspected) exposure to other viral communicable diseases: Secondary | ICD-10-CM | POA: Diagnosis not present

## 2021-07-23 DIAGNOSIS — R0981 Nasal congestion: Secondary | ICD-10-CM | POA: Diagnosis not present

## 2021-08-08 DIAGNOSIS — J111 Influenza due to unidentified influenza virus with other respiratory manifestations: Secondary | ICD-10-CM | POA: Diagnosis not present

## 2021-08-08 DIAGNOSIS — R0981 Nasal congestion: Secondary | ICD-10-CM | POA: Diagnosis not present

## 2021-08-17 DIAGNOSIS — J209 Acute bronchitis, unspecified: Secondary | ICD-10-CM | POA: Diagnosis not present

## 2021-08-17 DIAGNOSIS — R0981 Nasal congestion: Secondary | ICD-10-CM | POA: Diagnosis not present

## 2021-08-27 ENCOUNTER — Other Ambulatory Visit: Payer: Self-pay | Admitting: Internal Medicine

## 2021-08-27 DIAGNOSIS — E785 Hyperlipidemia, unspecified: Secondary | ICD-10-CM

## 2021-09-10 DIAGNOSIS — J45909 Unspecified asthma, uncomplicated: Secondary | ICD-10-CM | POA: Diagnosis not present

## 2021-09-17 ENCOUNTER — Ambulatory Visit
Admission: RE | Admit: 2021-09-17 | Discharge: 2021-09-17 | Disposition: A | Discharge status: Home / Self Care | Payer: No Typology Code available for payment source | Source: Ambulatory Visit | Attending: Internal Medicine | Admitting: Internal Medicine

## 2021-09-17 DIAGNOSIS — E785 Hyperlipidemia, unspecified: Secondary | ICD-10-CM

## 2021-09-18 DIAGNOSIS — L400 Psoriasis vulgaris: Secondary | ICD-10-CM | POA: Diagnosis not present

## 2021-09-18 DIAGNOSIS — Z111 Encounter for screening for respiratory tuberculosis: Secondary | ICD-10-CM | POA: Diagnosis not present

## 2021-10-01 DIAGNOSIS — J029 Acute pharyngitis, unspecified: Secondary | ICD-10-CM | POA: Diagnosis not present

## 2021-10-04 DIAGNOSIS — J0100 Acute maxillary sinusitis, unspecified: Secondary | ICD-10-CM | POA: Diagnosis not present

## 2021-10-04 DIAGNOSIS — R051 Acute cough: Secondary | ICD-10-CM | POA: Diagnosis not present

## 2021-10-12 DIAGNOSIS — G4733 Obstructive sleep apnea (adult) (pediatric): Secondary | ICD-10-CM | POA: Diagnosis not present

## 2021-10-25 DIAGNOSIS — I2510 Atherosclerotic heart disease of native coronary artery without angina pectoris: Secondary | ICD-10-CM | POA: Diagnosis not present

## 2021-10-31 DIAGNOSIS — M542 Cervicalgia: Secondary | ICD-10-CM | POA: Diagnosis not present

## 2021-10-31 DIAGNOSIS — M359 Systemic involvement of connective tissue, unspecified: Secondary | ICD-10-CM | POA: Diagnosis not present

## 2021-10-31 DIAGNOSIS — M79671 Pain in right foot: Secondary | ICD-10-CM | POA: Diagnosis not present

## 2021-10-31 DIAGNOSIS — M3500 Sicca syndrome, unspecified: Secondary | ICD-10-CM | POA: Diagnosis not present

## 2021-10-31 DIAGNOSIS — M1990 Unspecified osteoarthritis, unspecified site: Secondary | ICD-10-CM | POA: Diagnosis not present

## 2021-11-30 DIAGNOSIS — G4733 Obstructive sleep apnea (adult) (pediatric): Secondary | ICD-10-CM | POA: Diagnosis not present

## 2021-12-26 ENCOUNTER — Ambulatory Visit (INDEPENDENT_AMBULATORY_CARE_PROVIDER_SITE_OTHER): Payer: PPO | Admitting: Adult Health

## 2021-12-26 ENCOUNTER — Other Ambulatory Visit: Payer: Self-pay

## 2021-12-26 ENCOUNTER — Encounter: Payer: Self-pay | Admitting: Adult Health

## 2021-12-26 VITALS — BP 133/78 | HR 53 | Ht 72.0 in | Wt 242.1 lb

## 2021-12-26 DIAGNOSIS — G4733 Obstructive sleep apnea (adult) (pediatric): Secondary | ICD-10-CM

## 2021-12-26 DIAGNOSIS — G629 Polyneuropathy, unspecified: Secondary | ICD-10-CM

## 2021-12-26 DIAGNOSIS — Z9989 Dependence on other enabling machines and devices: Secondary | ICD-10-CM | POA: Diagnosis not present

## 2021-12-26 NOTE — Patient Instructions (Signed)
Your Plan: ? ?Continue using CPAP  ?CPAP titration ordered- Sleep lab will call you ?If your symptoms worsen or you develop new symptoms please let us know.  ? ?Thank you for coming to see [place] at [facility]. I hope we have been able to provide you high quality care today. ? ?You may receive a patient satisfaction survey over the next few weeks. We would appreciate your feedback and comments so that we may continue to improve ourselves and the health of our patients. ? ?

## 2021-12-26 NOTE — Progress Notes (Addendum)
? ? ?PATIENT: Jeffrey Luna ?DOB: 04/18/1956 ? ?REASON FOR VISIT: follow up ?HISTORY FROM: patient ?PRIMARY NEUROLOGIST: Dr. Rexene Alberts ? ?Chief Complaint  ?Patient presents with  ? Follow-up  ?  Pt in 20 pt is here for CPAP follow up  Pt states at times he feels like he is not getting enough air through his mask   ? ? ? ?HISTORY OF PRESENT ILLNESS: ?Today 12/26/21: ? ?Jeffrey Luna is a 66 year old male with a history of obstructive sleep apnea on CPAP and neuropathy.  He returns today for follow-up.  His download is below.  He states that he still feels that he does not get enough air at night.  However he has noticed the benefit since he started CPAP.  States that he is no longer falling asleep at stop lights or while eating dinner.  He states that some nights he does fall asleep in his chair and will wake up hours later and put the CPAP on and go to bed.  At the last visit Dr. Rexene Alberts adjusted his pressure however his residual AHI increased rather than decreased. ? ? ?At the last visit the patient was scheduled for nerve conductions that did show polyneuropathy.  Lab work showed abnormal ANA and was referred to rheumatologist at [facility]. ? ?Burning and tingling in the feet. Worse when he is walking better at rest. He does not feel his symptoms are worse. Currently taking Lyrica 150 BID and reports that he does help. PCP is managing lyrica.  ? ? ? ? ?REVIEW OF SYSTEMS: Out of a complete 14 system review of symptoms, the patient complains only of the following symptoms, and all other reviewed systems are negative. ? ? ?ESS 8 ? ?ALLERGIES: ?Allergies  ?Allergen Reactions  ? Prozac [Fluoxetine]   ?  Heart Rate increased   ? ? ?HOME MEDICATIONS: ?Outpatient Medications Prior to Visit  ?Medication Sig Dispense Refill  ? amLODipine (NORVASC) 5 MG tablet Take 5 mg by mouth daily.    ? atorvastatin (LIPITOR) 40 MG tablet Take 1 tablet by mouth daily.    ? Multiple Vitamins-Minerals (PRESERVISION AREDS PO)  Take by mouth.    ? Multiple Vitamins-Minerals (ZINC PO) Take by mouth.    ? pantoprazole (PROTONIX) 40 MG tablet Take 40 mg by mouth 2 (two) times daily.    ? pregabalin (LYRICA) 150 MG capsule Take 1 capsule by mouth 2 (two) times daily.    ? RESTASIS 0.05 % ophthalmic emulsion     ? SENNA CO by Combination route.    ? triamterene-hydrochlorothiazide (MAXZIDE-25) 37.5-25 MG tablet Take 1 tablet by mouth every morning.    ? ?No facility-administered medications prior to visit.  ? ? ?PAST MEDICAL HISTORY: ?Past Medical History:  ?Diagnosis Date  ? High blood pressure   ? Neuropathy   ? Snoring   ? ? ?PAST SURGICAL HISTORY: ?Past Surgical History:  ?Procedure Laterality Date  ? NASAL SINUS SURGERY    ? ? ?FAMILY HISTORY: ?Family History  ?Problem Relation Age of Onset  ? Dementia Mother   ? Heart attack Father   ? Neuropathy Father   ? Sleep apnea Neg Hx   ? ? ?SOCIAL HISTORY: ?Social History  ? ?Socioeconomic History  ? Marital status: Married  ?  Spouse name: Not on file  ? Number of children: Not on file  ? Years of education: Not on file  ? Highest education level: Not on file  ?Occupational History  ? Not on file  ?  Tobacco Use  ? Smoking status: Former  ?  Types: Cigarettes  ?  Quit date: 2010  ?  Years since quitting: 13.2  ? Smokeless tobacco: Never  ?Vaping Use  ? Vaping Use: Never used  ?Substance and Sexual Activity  ? Alcohol use: Never  ? Drug use: Never  ? Sexual activity: Not on file  ?Other Topics Concern  ? Not on file  ?Social History Narrative  ? Not on file  ? ?Social Determinants of Health  ? ?Financial Resource Strain: Not on file  ?Food Insecurity: Not on file  ?Transportation Needs: Not on file  ?Physical Activity: Not on file  ?Stress: Not on file  ?Social Connections: Not on file  ?Intimate Partner Violence: Not on file  ? ? ? ? ?PHYSICAL EXAM ? ?Vitals:  ? 12/26/21 0929  ?BP: 133/78  ?Pulse: (!) 53  ?Weight: 242 lb 1.6 oz (109.8 kg)  ?Height: 6' (1.829 m)  ? ?Body mass index is 32.83  kg/m?. ? ?Generalized: Well developed, in no acute distress  ?Chest: Lungs clear to auscultation bilaterally ? ?Neurological examination  ?Mentation: Alert oriented to time, place, history taking. Follows all commands speech and language fluent ?Cranial nerve II-XII: Extraocular movements were full, visual field were full on confrontational test Head turning and shoulder shrug  were normal and symmetric. ?Motor: The motor testing reveals 5 over 5 strength of all 4 extremities. Good symmetric motor tone is noted throughout.  ?Sensory: Sensory testing is intact to soft touch on all 4 extremities. No evidence of extinction is noted.  ?Gait and station: Gait is normal.  ? ? ?DIAGNOSTIC DATA (LABS, IMAGING, TESTING) ?- I reviewed patient records, labs, notes, testing and imaging myself where available. ? ? ?   ?Component Value Date/Time  ? NA 142 05/15/2021 1007  ? K 4.5 05/15/2021 1007  ? CL 103 05/15/2021 1007  ? CO2 24 05/15/2021 1007  ? GLUCOSE 101 (H) 05/15/2021 1007  ? BUN 14 05/15/2021 1007  ? CREATININE 0.94 05/15/2021 1007  ? CALCIUM 9.9 05/15/2021 1007  ? PROT 6.9 05/15/2021 1007  ? ALBUMIN 4.7 05/15/2021 1007  ? AST 30 05/15/2021 1007  ? ALT 37 05/15/2021 1007  ? ALKPHOS 102 05/15/2021 1007  ? BILITOT 0.5 05/15/2021 1007  ? ? ?Lab Results  ?Component Value Date  ? HGBA1C 5.9 (H) 05/15/2021  ? ?Lab Results  ?Component Value Date  ? VITAMINB12 >2000 (H) 05/15/2021  ? ?Lab Results  ?Component Value Date  ? TSH 3.430 05/15/2021  ? ? ? ? ?ASSESSMENT AND PLAN ?66 y.o. year old male  has a past medical history of High blood pressure, Neuropathy, and Snoring. here with: ? ?OSA on CPAP ? ?- CPAP compliance excellent ?- Residual AHI remains elevated will order CPAP titration ?- Encourage patient to continue using CPAP nightly and > 4 hours each night ? ?2.  Neuropathy ? ?-Currently on Lyrica 150 mg twice a day prescribed by PCP ?-Does not feel that symptoms are worse ? ?- F/U in 6 months or sooner if needed ? ? ?Ward Givens, MSN, NP-C 12/26/2021, 9:42 AM ?[facility] ?[address], [address] ?[place], [place] [address] ?[phone] ? ? ?

## 2022-01-17 ENCOUNTER — Ambulatory Visit (INDEPENDENT_AMBULATORY_CARE_PROVIDER_SITE_OTHER): Payer: PPO | Admitting: Neurology

## 2022-01-17 DIAGNOSIS — G4733 Obstructive sleep apnea (adult) (pediatric): Secondary | ICD-10-CM

## 2022-01-17 DIAGNOSIS — G4720 Circadian rhythm sleep disorder, unspecified type: Secondary | ICD-10-CM

## 2022-01-25 NOTE — Procedures (Signed)
S PATIENT'S NAME:  Jeffrey Luna, Jeffrey Luna ?DOB:      06-29-1956      ?MR#:    [id number]     ?DATE OF RECORDING: 01/17/2022 ?REFERRING M.D.:  Butch Penny, NP  ?Study Performed:   CPAP  Titration ?HISTORY: 66 year old man with a history of hypertension, vitamin B12 deficiency, edema, renal artery stenosis, psoriasis, hyperlipidemia, reflux disease, obesity, pre-diabetes, and obstructive sleep apnea, on treatment with AutoPap therapy, who presents for a full night titration study to optimize his OSA treatment. He has a moderately elevate AHI of autoPAP therapy. The patient's weight 243 pounds with a height of 72 (inches), resulting in a BMI of 32.8 kg/m2. ? ?CURRENT MEDICATIONS: Norvasc, Lipitor, Multivitamins, Zinc, Protonix, Lyrica, Restasis, Senna CO, Maxzide-25 ? ?PROCEDURE:  This is a multichannel digital polysomnogram utilizing the SomnoStar 11.2 system.  Electrodes and sensors were applied and monitored per AASM Specifications.   EEG, EOG, Chin and Limb EMG, were sampled at 200 Hz.  ECG, Snore and Nasal Pressure, Thermal Airflow, Respiratory Effort, CPAP Flow and Pressure, Oximetry was sampled at 50 Hz. Digital video and audio were recorded.     ? ?The patient was fitted with a s/m Evora FFM (he has been using a L F20 FFM at home). CPAP was initiated at 5 cmH20 with heated humidity per AASM standards and pressure was advanced to 15 cmH20 because of hypopneas, apneas and desaturations.  At a PAP pressure of 15 cmH20, there was a reduction of the AHI to 0.8/hour with a brief O2 nadir of 85% and supine REM sleep achieved. ? ?Lights Out was at 21:51 and Lights On at 04:55. Total recording time (TRT) was 424.5 minutes, with a total sleep time (TST) of 396.5 minutes. The patient's sleep latency was 21 minutes. REM latency was 324.5 minutes, which is markedly delayed. The sleep efficiency was 93.4 %.   ? ?SLEEP ARCHITECTURE: WASO (Wake after sleep onset)  was 17.5 minutes.  There were 11 minutes in Stage N1, 302.5 minutes  Stage N2, 71.5 minutes Stage N3 and 11.5 minutes in Stage REM.  The percentage of Stage N1 was 2.8%, Stage N2 was 76.3%, which is markedly increased, Stage N3 was 18.% and Stage R (REM sleep) was 2.9%, which is markedly reduced. The arousals were noted as: 35 were spontaneous, 0 were associated with PLMs, 50 were associated with respiratory events. ? ?RESPIRATORY ANALYSIS:  There was a total of 88 respiratory events: 16 obstructive apneas, 10 central apneas and 1 mixed apneas with a total of 27 apneas and an apnea index (AI) of 4.1 /hour. There were 61 hypopneas with a hypopnea index of 9.2/hour. The patient also had 0 respiratory event related arousals (RERAs).     ? ?The total APNEA/HYPOPNEA INDEX  (AHI) was 13.3 /hour and the total RESPIRATORY DISTURBANCE INDEX was 13.3 /hour  0 events occurred in REM sleep and 88 events in NREM. The REM AHI was 0 /hour versus a non-REM AHI of 13.7 /hour.  The patient spent 396.5 minutes of total sleep time in the supine position and 0 minutes in non-supine. The supine AHI was 13.3, versus a non-supine AHI of 0.0. ? ?OXYGEN SATURATION & C02:  The baseline 02 saturation was 95%, with the lowest being 75%. Time spent below 89% saturation equaled 42 minutes. ? ?PERIODIC LIMB MOVEMENTS:  The patient had a total of 0 Periodic Limb Movements. The Periodic Limb Movement (PLM) index was 0 and the PLM Arousal index was 0 /hour. ? ?Audio and video  analysis did not show any abnormal or unusual movements, behaviors, phonations or vocalizations. The patient took 1 bathroom break. Snoring reduced. The EKG was in keeping with normal sinus rhythm (NSR). ? ?Post-study, the patient indicated that sleep was the same as usual.  ? ?DIAGNOSIS:  ?Obstructive Sleep Apnea  ?Dysfunctions associated with sleep stages or arousals from sleep ? ?RECOMMENDATIONS: ?This study demonstrates significant improvement to near-resolution of the patient's obstructive sleep apnea with CPAP of 15 cm via s/m Evora FFM. I  recommend change from autoPAP therapy to these settings and a follow up in sleep clinic in about 3 months. The patient should be reminded to be fully compliant with PAP therapy to improve sleep related symptoms and decrease long term cardiovascular risks. Please note that untreated obstructive sleep apnea may carry additional perioperative morbidity. Patients with significant obstructive sleep apnea should receive perioperative PAP therapy and the surgeons and particularly the anesthesiologist should be informed of the diagnosis and the severity of the sleep disordered breathing. ?This study shows abnormal sleep stage percentages; these are nonspecific findings and per se do not signify an intrinsic sleep disorder or a cause for the patient's sleep-related symptoms. Causes include (but are not limited to) the first night effect of the sleep study, circadian rhythm disturbances, medication effect or an underlying mood disorder or medical problem.  ?The patient should be cautioned not to drive, work at heights, or operate dangerous or heavy equipment when tired or sleepy. Review and reiteration of good sleep hygiene measures should be pursued with any patient. ?The patient will be seen in follow-up in the sleep clinic at [facility] for discussion of the test results, symptom and treatment compliance review, further management strategies, etc. The referring provider will be notified of the test results. ? ?I certify that I have reviewed the entire raw data recording prior to the issuance of this report in accordance with the Standards of Accreditation of the American Academy of Sleep Medicine (AASM) ? ?Huston Foley, MD, PhD ?Diplomat, American Board of Neurology and Sleep Medicine (Neurology and Sleep Medicine) ?

## 2022-01-29 ENCOUNTER — Telehealth: Payer: Self-pay | Admitting: *Deleted

## 2022-01-29 NOTE — Telephone Encounter (Signed)
-----   Message from Butch Penny, NP sent at 01/29/2022  8:42 AM EDT ----- ?Please call the patient and let him know that CPAP titration showed that he had resolution of apnea at 15 cm of water.  I will change him to a set pressure.  Order has been placed ?

## 2022-01-29 NOTE — Telephone Encounter (Signed)
Called and spoke with pt about results per MM,NP note. Confirmed DME [facility]. I sent community message that order placed. He is aware they will f/u with him to make adjustments/get new mask.  ?

## 2022-01-29 NOTE — Addendum Note (Signed)
Addended by: Enedina Finner on: 01/29/2022 08:42 AM ? ? Modules accepted: Orders ? ?

## 2022-01-30 DIAGNOSIS — G4733 Obstructive sleep apnea (adult) (pediatric): Secondary | ICD-10-CM | POA: Diagnosis not present

## 2022-02-21 DIAGNOSIS — L400 Psoriasis vulgaris: Secondary | ICD-10-CM | POA: Diagnosis not present

## 2022-03-26 ENCOUNTER — Telehealth: Payer: Self-pay | Admitting: Adult Health

## 2022-03-26 DIAGNOSIS — G4733 Obstructive sleep apnea (adult) (pediatric): Secondary | ICD-10-CM

## 2022-03-26 NOTE — Telephone Encounter (Signed)
Pt states he received a message from DME that his CPAP readings were not coming thru.  Pt states he was advised to unplug the CPAP for an hour, pt states he did what was suggested but has not been able to make contact with DME since then.  Pt would like to know if RN can check to see if his information is downloading as it should, please call.

## 2022-03-27 NOTE — Telephone Encounter (Signed)
Please let patient know that leak is worse and AHI has increased. Does he feel the mask leaking at night? May need to tighten straps or switch to another mask

## 2022-03-27 NOTE — Telephone Encounter (Signed)
Called pt & LVM (ok per DPR) advising per MM NP that data was reviewed and his leak is worse and the AHI has increased. Asked pt if he feels the mask leaking at night. Advised he may need to tighten his straps or possibly get a new mask. Asked for call back. Left office number in message.

## 2022-03-28 NOTE — Addendum Note (Signed)
Addended by: Guy Begin on: 03/28/2022 03:19 PM   Modules accepted: Orders

## 2022-03-28 NOTE — Addendum Note (Signed)
Addended by: Enedina Finner on: 03/28/2022 03:48 PM   Modules accepted: Orders

## 2022-03-28 NOTE — Telephone Encounter (Signed)
Spoke to pt.  He did tighten his straps and noted no leak until early am,  but was too tight (squeezing his brain out).  Would like to go [facility] [facility] [facility] location [phone].  Need mask refitting  (would like to go to location not just have sent to him).  I will place that in order.

## 2022-03-28 NOTE — Telephone Encounter (Signed)
Order sent to [facility]  

## 2022-04-29 DIAGNOSIS — H04123 Dry eye syndrome of bilateral lacrimal glands: Secondary | ICD-10-CM | POA: Diagnosis not present

## 2022-04-29 DIAGNOSIS — H40053 Ocular hypertension, bilateral: Secondary | ICD-10-CM | POA: Diagnosis not present

## 2022-05-01 DIAGNOSIS — L400 Psoriasis vulgaris: Secondary | ICD-10-CM | POA: Diagnosis not present

## 2022-05-01 DIAGNOSIS — L219 Seborrheic dermatitis, unspecified: Secondary | ICD-10-CM | POA: Diagnosis not present

## 2022-05-08 DIAGNOSIS — G4733 Obstructive sleep apnea (adult) (pediatric): Secondary | ICD-10-CM | POA: Diagnosis not present

## 2022-05-15 DIAGNOSIS — G4733 Obstructive sleep apnea (adult) (pediatric): Secondary | ICD-10-CM | POA: Diagnosis not present

## 2022-06-26 DIAGNOSIS — L400 Psoriasis vulgaris: Secondary | ICD-10-CM | POA: Diagnosis not present

## 2022-06-26 DIAGNOSIS — L304 Erythema intertrigo: Secondary | ICD-10-CM | POA: Diagnosis not present

## 2022-07-01 ENCOUNTER — Ambulatory Visit: Payer: PPO | Admitting: Adult Health

## 2022-07-01 ENCOUNTER — Encounter: Payer: Self-pay | Admitting: Adult Health

## 2022-07-02 NOTE — Progress Notes (Unsigned)
PATIENT: Jeffrey Luna DOB: 11-18-1955  REASON FOR VISIT: follow up HISTORY FROM: patient PRIMARY NEUROLOGIST: Dr. Frances Furbish  Chief Complaint  Patient presents with   Follow-up    Pt in 19 Pt here CPAP f/u  Pt states no questions or concerns for today's visit       HISTORY OF PRESENT ILLNESS: Today 07/03/22:  Jeffrey Luna is a 66 year old male with a history of obstructive sleep apnea on CPAP.  He returns today for follow-up.  He has CPAP titration in the past that showed pressure of 15 treated his apnea the best.  His download is below.  He is still having apneic events although he reports that he is sleeping well.  Reports that he can tell a difference since he started using the machine.  His download is below    3/29/23Mr. Luna is a 66 year old male with a history of obstructive sleep apnea on CPAP and neuropathy.  He returns today for follow-up.  His download is below.  He states that he still feels that he does not get enough air at night.  However he has noticed the benefit since he started CPAP.  States that he is no longer falling asleep at stop lights or while eating dinner.  He states that some nights he does fall asleep in his chair and will wake up hours later and put the CPAP on and go to bed.  At the last visit Dr. Frances Furbish adjusted his pressure however his residual AHI increased rather than decreased.   At the last visit the patient was scheduled for nerve conductions that did show polyneuropathy.  Lab work showed abnormal ANA and was referred to rheumatologist at [facility].  Burning and tingling in the feet. Worse when he is walking better at rest. He does not feel his symptoms are worse. Currently taking Lyrica 150 BID and reports that he does help. PCP is managing lyrica.      REVIEW OF SYSTEMS: Out of a complete 14 system review of symptoms, the patient complains only of the following symptoms, and all other reviewed systems are  negative.    ALLERGIES: Allergies  Allergen Reactions   Prozac [Fluoxetine]     Heart Rate increased     HOME MEDICATIONS: Outpatient Medications Prior to Visit  Medication Sig Dispense Refill   amLODipine (NORVASC) 5 MG tablet Take 5 mg by mouth daily.     atorvastatin (LIPITOR) 40 MG tablet Take 1 tablet by mouth daily.     Multiple Vitamins-Minerals (PRESERVISION AREDS PO) Take by mouth.     Multiple Vitamins-Minerals (ZINC PO) Take by mouth.     pantoprazole (PROTONIX) 40 MG tablet Take 40 mg by mouth 2 (two) times daily.     pregabalin (LYRICA) 150 MG capsule Take 1 capsule by mouth 2 (two) times daily.     RESTASIS 0.05 % ophthalmic emulsion      SENNA CO by Combination route.     triamterene-hydrochlorothiazide (MAXZIDE-25) 37.5-25 MG tablet Take 1 tablet by mouth every morning.     No facility-administered medications prior to visit.    PAST MEDICAL HISTORY: Past Medical History:  Diagnosis Date   High blood pressure    Neuropathy    Snoring     PAST SURGICAL HISTORY: Past Surgical History:  Procedure Laterality Date   NASAL SINUS SURGERY      FAMILY HISTORY: Family History  Problem Relation Age of Onset   Dementia Mother    Heart attack Father  Neuropathy Father    Sleep apnea Neg Hx     SOCIAL HISTORY: Social History   Socioeconomic History   Marital status: Married    Spouse name: Not on file   Number of children: Not on file   Years of education: Not on file   Highest education level: Not on file  Occupational History   Not on file  Tobacco Use   Smoking status: Former    Types: Cigarettes    Quit date: 2010    Years since quitting: 13.7   Smokeless tobacco: Never  Vaping Use   Vaping Use: Never used  Substance and Sexual Activity   Alcohol use: Never   Drug use: Never   Sexual activity: Not on file  Other Topics Concern   Not on file  Social History Narrative   Not on file   Social Determinants of Health   Financial  Resource Strain: Not on file  Food Insecurity: Not on file  Transportation Needs: Not on file  Physical Activity: Not on file  Stress: Not on file  Social Connections: Not on file  Intimate Partner Violence: Not on file      PHYSICAL EXAM  Vitals:   07/03/22 1136  BP: 130/79  Pulse: (!) 54  Weight: 241 lb (109.3 kg)  Height: 6' (1.829 m)   Body mass index is 32.69 kg/m.  Generalized: Well developed, in no acute distress  Chest: Lungs clear to auscultation bilaterally  Neurological examination  Mentation: Alert oriented to time, place, history taking. Follows all commands speech and language fluent Cranial nerve II-XII: Extraocular movements were full, visual field were full on confrontational test Head turning and shoulder shrug  were normal and symmetric.  Gait and station: Gait is normal.    DIAGNOSTIC DATA (LABS, IMAGING, TESTING) - I reviewed patient records, labs, notes, testing and imaging myself where available.      Component Value Date/Time   NA 142 05/15/2021 1007   K 4.5 05/15/2021 1007   CL 103 05/15/2021 1007   CO2 24 05/15/2021 1007   GLUCOSE 101 (H) 05/15/2021 1007   BUN 14 05/15/2021 1007   CREATININE 0.94 05/15/2021 1007   CALCIUM 9.9 05/15/2021 1007   PROT 6.9 05/15/2021 1007   ALBUMIN 4.7 05/15/2021 1007   AST 30 05/15/2021 1007   ALT 37 05/15/2021 1007   ALKPHOS 102 05/15/2021 1007   BILITOT 0.5 05/15/2021 1007    Lab Results  Component Value Date   HGBA1C 5.9 (H) 05/15/2021   Lab Results  Component Value Date   VITAMINB12 >2000 (H) 05/15/2021   Lab Results  Component Value Date   TSH 3.430 05/15/2021      ASSESSMENT AND PLAN 66 y.o. year old male  has a past medical history of High blood pressure, Neuropathy, and Snoring. here with:  OSA on CPAP  - CPAP compliance excellent - Residual AHI remains elevated will increase pressure to 16 cm of water.  Patient will call us in 1 month for repeat download - Encourage patient to  continue using CPAP nightly and > 4 hours each night    - F/U in 6 months or sooner if needed   Butch Penny, MSN, NP-C 07/03/2022, 11:42 AM [facility] [address], [address] [place], [place] [address] [phone]

## 2022-07-03 ENCOUNTER — Encounter: Payer: Self-pay | Admitting: Adult Health

## 2022-07-03 ENCOUNTER — Ambulatory Visit: Payer: PPO | Admitting: Adult Health

## 2022-07-03 VITALS — BP 130/79 | HR 54 | Ht 72.0 in | Wt 241.0 lb

## 2022-07-03 DIAGNOSIS — G4733 Obstructive sleep apnea (adult) (pediatric): Secondary | ICD-10-CM

## 2022-07-04 NOTE — Progress Notes (Signed)
New, Willodean Rosenthal, RN; Darlina Guys; [address], Clovis Riley Received, Thank you!        Previous Messages    ----- Message -----  From: Brandon Melnick, RN  Sent: 07/03/2022   3:51 PM EDT  To: Darlina Guys; Miquel Dunn; Stephannie Peters; *  Subject: increase pressure 16cmH20 EPR 3                 New order in [facility]  Male, 66 y.o., 1956/01/27  MRN:  [id number]    I have made changes (pending) in [employer] RN

## 2022-07-23 DIAGNOSIS — I10 Essential (primary) hypertension: Secondary | ICD-10-CM | POA: Diagnosis not present

## 2022-07-23 DIAGNOSIS — Z125 Encounter for screening for malignant neoplasm of prostate: Secondary | ICD-10-CM | POA: Diagnosis not present

## 2022-07-29 DIAGNOSIS — Z1212 Encounter for screening for malignant neoplasm of rectum: Secondary | ICD-10-CM | POA: Diagnosis not present

## 2022-07-29 DIAGNOSIS — G4733 Obstructive sleep apnea (adult) (pediatric): Secondary | ICD-10-CM | POA: Diagnosis not present

## 2022-07-29 DIAGNOSIS — I10 Essential (primary) hypertension: Secondary | ICD-10-CM | POA: Diagnosis not present

## 2022-07-29 DIAGNOSIS — Z0000 Encounter for general adult medical examination without abnormal findings: Secondary | ICD-10-CM | POA: Diagnosis not present

## 2022-07-29 DIAGNOSIS — G629 Polyneuropathy, unspecified: Secondary | ICD-10-CM | POA: Diagnosis not present

## 2022-07-29 DIAGNOSIS — J45909 Unspecified asthma, uncomplicated: Secondary | ICD-10-CM | POA: Diagnosis not present

## 2022-07-29 DIAGNOSIS — I701 Atherosclerosis of renal artery: Secondary | ICD-10-CM | POA: Diagnosis not present

## 2022-07-29 DIAGNOSIS — L409 Psoriasis, unspecified: Secondary | ICD-10-CM | POA: Diagnosis not present

## 2022-07-29 DIAGNOSIS — Z23 Encounter for immunization: Secondary | ICD-10-CM | POA: Diagnosis not present

## 2022-07-29 DIAGNOSIS — I2510 Atherosclerotic heart disease of native coronary artery without angina pectoris: Secondary | ICD-10-CM | POA: Diagnosis not present

## 2022-08-01 ENCOUNTER — Institutional Professional Consult (permissible substitution): Payer: PPO | Admitting: Neurology

## 2022-08-13 ENCOUNTER — Telehealth: Payer: Self-pay | Admitting: Adult Health

## 2022-08-13 DIAGNOSIS — G4733 Obstructive sleep apnea (adult) (pediatric): Secondary | ICD-10-CM

## 2022-08-13 NOTE — Telephone Encounter (Signed)
I called the patient and let him know that Dr Frances Furbish had reviewed his data. His AHI is still elevated. She has increased his pressure. We will recheck his data at his next appt on 08/28/22. Patient verbalized understanding and appreciation.   I made this change in pressure to 17 in [facility] and I sent the order to [facility] for their records.

## 2022-08-13 NOTE — Telephone Encounter (Signed)
Pt called stating that he was informed to call a month after his cpap air flow was increased so that the reading can be checked to see if it is helping him. Please advise.

## 2022-08-13 NOTE — Addendum Note (Signed)
Addended by: Bertram Savin on: 08/13/2022 05:39 PM   Modules accepted: Orders

## 2022-08-13 NOTE — Telephone Encounter (Signed)
Any suggestions? I have increased pressure no benefit to AHI. Had titration in April

## 2022-08-13 NOTE — Telephone Encounter (Signed)
Huston Foley, MD  Butch Penny, NP2 hours ago (3:20 PM)   Please increase pressure to 17 cm sa

## 2022-08-13 NOTE — Telephone Encounter (Signed)
Spoke to patient made him aware that I DL his CPAP report and will forward to Megan,NP to review . Pt expressed understanding and thanked me for calling

## 2022-08-14 DIAGNOSIS — G4733 Obstructive sleep apnea (adult) (pediatric): Secondary | ICD-10-CM | POA: Diagnosis not present

## 2022-08-15 DIAGNOSIS — H6123 Impacted cerumen, bilateral: Secondary | ICD-10-CM | POA: Diagnosis not present

## 2022-08-26 DIAGNOSIS — I10 Essential (primary) hypertension: Secondary | ICD-10-CM | POA: Diagnosis not present

## 2022-08-28 ENCOUNTER — Encounter: Payer: Self-pay | Admitting: Neurology

## 2022-08-28 ENCOUNTER — Ambulatory Visit: Payer: PPO | Admitting: Neurology

## 2022-08-28 VITALS — BP 124/76 | HR 58 | Ht 72.0 in | Wt 244.6 lb

## 2022-08-28 DIAGNOSIS — G629 Polyneuropathy, unspecified: Secondary | ICD-10-CM

## 2022-08-28 DIAGNOSIS — G4733 Obstructive sleep apnea (adult) (pediatric): Secondary | ICD-10-CM | POA: Diagnosis not present

## 2022-08-28 NOTE — Progress Notes (Signed)
Subjective:    Patient ID: Jeffrey Luna is a 66 y.o. male.  HPI    Interim history:   Jeffrey Luna is a 66 year old right-handed gentleman with an underlying medical history of hypertension, vitamin B12 deficiency, edema, renal artery stenosis, psoriasis, hyperlipidemia, reflux disease, obesity, pre-diabetes, and obstructive sleep apnea, on treatment with AutoPap therapy (followed in our sleep clinic), who presents for follow-up consultation of his neuropathy.  The patient is unaccompanied today.  I last saw him on 05/15/2021 at the request of his primary care physician for evaluation of his longstanding history of neuropathy.  He had extensive blood work at the time through our office.  He also had an EMG and nerve conduction velocity test through our office on 05/17/2021 and I reviewed the results: Impression: Abnormal study demonstrating: - Axonal sensorimotor polyneuropathy.  He has been on Lyrica, currently 200 mg 3 times daily.  He saw Ward Givens, NP on 12/20/2021, at which time his AutoPap compliance was excellent, his residual AHI was elevated in the moderate range.  He was advised to proceed with a CPAP titration study.  He had a CPAP titration study on 01/17/2022, at which time his sleep apnea was well-controlled with a CPAP of 15 cm.  We changed his AutoPap to CPAP of 15 cm.  He had an appointment with Ward Givens, NP on 07/03/2022, at which time he was compliant with his CPAP of 15 cm, AHI was elevated around 15/h, we increased his CPAP pressure to 16 cm at the time.  Today, 08/28/2022: He reports doing about the same, he had swelling and was taken off his amlodipine a couple of days ago and his diuretic was changed from [facility] to chlorthalidone.  He feels that his neuropathy has been stable, he feels no worse, has had symptoms for about 8 years.  I had referred him to rheumatology but he canceled the appointment last year, he ended up seeing a rheumatologist through [place] [facility] [place] and reports that he saw the rheumatologist a few times, he was advised that he had arthritis. He saw his primary care physician on 07/29/2022, at which time he was complaining of lower extremity swelling.  He was hesitant to increase his pregabalin at the time.  He was generic Lyrica 200 mg 3 times a day.  He was also on tramadol 50 mg twice daily as needed.  He had blood work through primary care on 07/23/2022 and I reviewed the results.  Lipid panel showed a total cholesterol of 150, triglycerides elevated at 188, HDL lower at 34, LDL 78.  PSA in the normal range at 0.391.  CBC with differential and platelets benign.  Hemoglobin was 14.1, hematocrit 41.3.  CMP showed benign findings, sodium 141, potassium 3.8, alk phos was 95, ALT 60, AST 30, BUN 15, creatinine 0.97.  ANA was negative.  CRP was normal at 0.07, ESR 4, A1c was in the prediabetes range at 5.9.  I reviewed his CPAP compliance data from 07/29/2022 through 08/27/2022, which is a total of 30 days, during which time he used his machine every night with percent use days greater than 4 hours at 100%, indicating superb compliance with an average usage of 6 hours and 29 minutes, residual AHI mildly elevated at 13.6, improved from last time, leak on the low side with the 95th percentile at 4.8 L/min on a pressure of 17 cm with EPR of 3.  He tolerates the CPAP at the current pressure and is willing to increase it to  18 cm at this time.  Of note, he has a history of lower extremity swelling for years.  The patient's allergies, current medications, family history, past medical history, past social history, past surgical history and problem list were reviewed and updated as appropriate.    Previously:   05/15/21: (He) reports an approximately 6-year history of numbness affecting both feet, particularly the toes and also pain when walking or standing for prolonged period of time.  Pain seems to be confined to the toes.  It used to be when active  but pain has progressed.  He has been on the Lyrica.  It is currently at 150 mg 3 times daily.  Numbness affects particularly the forefeet areas, he has not fallen thankfully.  He has not had any upper body symptoms.  His father was diagnosed with neuropathy later in life.  He had diabetes as well.  His symptoms manifested in his 19s however.  He is currently on oral B12 supplementation but in the past had monthly injections.  He is trying to hydrate better and working on lifestyle changes.  Smoking some 20 years ago.  He does not currently drink any alcohol, he likes to drink sweet tea and soda and is working on reducing those.  He is working on weight loss.  His AutoPap is going well, he sleeps very well with that and endorses full compliance but leak bothers him from time to time.  He is a mouth breather and has a traditionally shaped fullface mask, he has had difficulty with seal as he gets a lot of moisture around his face.  The humidity setting was reduced by his DME representative but he ends up alternating between 2 masks in the middle of the night exchanging the moist mask with a dryer 1.  He would be willing to try a different style as he does have a mark on his nasal bridge.  He is very motivated to continue with his AutoPap, feels like he cannot sleep without it now.   I reviewed your office note from 12/28/20. He had blood work at the time including A1c which was 6.0, B12 which was 396.      12/26/2020: 66 year old right-handed gentleman with an underlying medical history of hypertension, vitamin B12 deficiency, edema, psoriasis, allergic rhinitis, renal artery stenosis, hyperlipidemia, reflux disease, neuropathy and obesity, who presents for follow-up consultation of his obstructive sleep apnea after interim testing and starting AutoPap therapy.  The patient is accompanied by his wife today.  I first met him at the request of his primary care physician on 07/13/2020, at which time he reported  snoring and excessive daytime somnolence.  He was advised to proceed with sleep testing.  He had a home sleep test on 08/28/2020 which indicated severe obstructive sleep apnea with an AHI of 53.1/h, O2 nadir of 82%.  Mild to moderate snoring was noted.  He was advised to start AutoPap therapy.  His set up date was 11/02/2020. I reviewed his AutoPap compliance data from 11/25/2020 through 12/24/2020, which is a total of 30 days, during which time he used his machine every night with percent use days greater than 4 hours at 100%, indicating superb compliance with an average usage of 6 hours and 29 minutes, residual AHI elevated at 15.5/h with primarily obstructive events, 95th percentile of pressure at 13.7 cm with a range of 7 to 14 cm with EPR.  Leak on the higher side with a 95th percentile at 19.5 L/min but leak has  improved slightly in the recent past per breakdown on a day-to-day basis on his download.  He reports doing well with his AutoPap.  Initially, he had trouble adjusting to the pressure but now he feels well.  He is using a full facemask, he is a mouth breather.  He does have dry mouth which is his biggest complaints but overall he feels improved and is highly motivated to continue with treatment.  His wife has noticed residual breathing pauses while he is asleep.  He is agreeable to trying a higher pressure.  He reports a longstanding history of foot pain.  He has been on Lyrica which has been helpful.  He has a history of B12 deficiency and had injections monthly before.  He is going to talk to his primary care physician about a referral for evaluation of neuropathy through our office.  He has not had a evaluation through vascular surgery, reports discoloration of his feet and swelling of his lower extremities despite taking 2 diuretics and pain with activity.  Pain subsides when he rests.   07/13/20: (He) reports snoring and excessive daytime somnolence.  I reviewed your office note from 06/06/2020.  He  has been on modafinil.  His duloxetine was recently reduced. In fact, he stopped taking it.  Of note, he also takes Lyrica 150 mg twice daily.  He has been on Lyrica for several years.  Modafinil is 200 mg once daily.  His Epworth sleepiness score is 12 out of 24, fatigue severity score is 30 out of 63.  He is married and lives with his wife, they have 2 grown children.  He works as a Theme park manager.  They have 1 cat in the household.  Bedtime is generally around 11 but he often falls asleep before then.  He sleeps in a recliner.  He has done so for the past 2 or 3 years.  Rise time is between 530 and 6 AM.  He has nocturia about 2-3 times per average night and has had the occasional morning headache.  He has become sleepy at the wheel at times but has not fallen asleep at the wheel.  He has had a 40 pound weight gain over the past 5 years approximately.  He quit smoking some 25 to 30 years ago, drinks no alcohol, caffeine in the form of soda, 1/day on average.     His Past Medical History Is Significant For: Past Medical History:  Diagnosis Date   High blood pressure    Neuropathy    Snoring     His Past Surgical History Is Significant For: Past Surgical History:  Procedure Laterality Date   NASAL SINUS SURGERY      His Family History Is Significant For: Family History  Problem Relation Age of Onset   Dementia Mother    Heart attack Father    Neuropathy Father    Sleep apnea Neg Hx     His Social History Is Significant For: Social History   Socioeconomic History   Marital status: Married    Spouse name: Not on file   Number of children: Not on file   Years of education: Not on file   Highest education level: Not on file  Occupational History   Not on file  Tobacco Use   Smoking status: Former    Types: Cigarettes    Quit date: 2010    Years since quitting: 13.9   Smokeless tobacco: Never  Vaping Use   Vaping Use: Never used  Substance and Sexual Activity   Alcohol use: Never    Drug use: Never   Sexual activity: Not on file  Other Topics Concern   Not on file  Social History Narrative   Right Handed   2 Cans per Day   Social Determinants of Health   Financial Resource Strain: Not on file  Food Insecurity: Not on file  Transportation Needs: Not on file  Physical Activity: Not on file  Stress: Not on file  Social Connections: Not on file    His Allergies Are:  Allergies  Allergen Reactions   Prozac [Fluoxetine]     Heart Rate increased   :   His Current Medications Are:  Outpatient Encounter Medications as of 08/28/2022  Medication Sig   atorvastatin (LIPITOR) 40 MG tablet Take 1 tablet by mouth daily.   chlorthalidone (HYGROTON) 25 MG tablet Take 25 mg by mouth daily.   Multiple Vitamins-Minerals (PRESERVISION AREDS PO) Take by mouth.   Multiple Vitamins-Minerals (ZINC PO) Take by mouth.   pantoprazole (PROTONIX) 40 MG tablet Take 40 mg by mouth 2 (two) times daily.   pregabalin (LYRICA) 150 MG capsule Take 1 capsule by mouth 2 (two) times daily.   RESTASIS 0.05 % ophthalmic emulsion    amLODipine (NORVASC) 5 MG tablet Take 5 mg by mouth daily. (Patient not taking: Reported on 08/28/2022)   SENNA CO by Combination route. (Patient not taking: Reported on 08/28/2022)   triamterene-hydrochlorothiazide (MAXZIDE-25) 37.5-25 MG tablet Take 1 tablet by mouth every morning. (Patient not taking: Reported on 08/28/2022)   No facility-administered encounter medications on file as of 08/28/2022.  :  Review of Systems:  Out of a complete 14 point review of systems, all are reviewed and negative with the exception of these symptoms as listed below:  Review of Systems  Neurological:        Pt is here Alone. Pt is here for Neuropathy.  Pt states that both of his feet have Neuropathy pain. Pt states its a burning sensation in his feet. Pt states it hasn't gotten any better. Pt states pain started 8 years ago.     Objective:  Neurological Exam  Physical  Exam Physical Examination:   Vitals:   08/28/22 0929  BP: 124/76  Pulse: (!) 58    General Examination: The patient is a very pleasant 66 y.o. male in no acute distress. He appears well-developed and well-nourished and well groomed.   HEENT: Normocephalic, atraumatic, pupils are equal, round and reactive to light, extraocular tracking is good without limitation to gaze excursion or nystagmus noted.  Corrective eyeglasses in place.  Hearing is grossly intact. Face is symmetric with normal facial animation and intact sensation to light touch, vibration and temperature. Speech is clear with no dysarthria noted. There is no hypophonia. There is no lip, neck/head, jaw or voice tremor. Neck is supple with full range of passive and active motion. There are no carotid bruits on auscultation. Oropharynx exam reveals: moderate mouth dryness, adequate dental hygiene and mild airway crowding.  Tongue protrudes centrally in palate elevates symmetrically.    Chest: Clear to auscultation without wheezing, rhonchi or crackles noted.   Heart: S1+S2+0, regular, bradycardia noted, no symptoms.    Abdomen: Soft, non-tender and non-distended.   Extremities: There is trace to 1+ pitting edema in the distal lower extremities bilaterally, up to lower shin areas.   Skin: Warm and dry with mild evidence of psoriatic plaques noted as well as mildly discolored feet with purplish discoloration.  Stable.   Musculoskeletal: exam reveals no obvious joint deformities.    Neurologically:  Mental status: The patient is awake, alert and oriented in all 4 spheres. His immediate and remote memory, attention, language skills and fund of knowledge are appropriate. There is no evidence of aphasia, agnosia, apraxia or anomia. Speech is clear with normal prosody and enunciation. Thought process is linear. Mood is normal and affect is normal.  Cranial nerves II - XII are as described above under HEENT exam.  Motor exam: Normal  bulk, strength and tone is noted. There is no resting or postural tremor, no drift, no rebound.  Fine motor skills and coordination: Grossly intact.  Reflexes are trace to 1+ in the upper extremities and absent in both knees, absent in both ankles.  Toes are downgoing bilaterally. Cerebellar testing: No dysmetria or intention tremor, finger-to-nose and heel-to-shin unremarkable. There is no truncal or gait ataxia.  Sensory exam: intact to light touch in the upper and lower extremities, decreased sensation to all modalities tested including vibration, temperature and light touch in the lower extremities up to the knees bilaterally.  Gait, station and balance: He stands without difficulty.  He has no obvious limp while walking, reports no significant foot pain currently while walking, no walking aid.   Assessment and plan:  In summary, Jeffrey Luna is a very pleasant 66 year old male with an underlying medical history of hypertension, vitamin B12 deficiency, edema, renal artery stenosis, psoriasis, hyperlipidemia, reflux disease, obesity, pre-diabetes, and obstructive sleep apnea, on treatment with AutoPap therapy, who presents for follow-up consultation of his neuropathy, EMG and nerve conduction velocity testing in 2020 to confirm neuropathy.  He has had symptoms for years, he feels more or less stable.  He is on high-dose Lyrica which can also cause swelling but he has recently been advised to stop the amlodipine.  His swelling may improve, he is just made some changes in his medications and we mutually agreed to not make any additional changes.  He has seen rheumatology in the past.  He has prediabetes.  He is advised that we could request a referral to a neuromuscular specialist at one of the academic neurology centers such as [facility] or [facility] or [facility] but he declines.  He feels stable, he has not had any recent falls, we had increased his CPAP pressure when he was seen by Ward Givens last time.  His  residual AHI has improved since the pressure was increased to 17 cm on his CPAP, he is willing to try 18 cm at this time and we made a change today. He is advised to keep his current scheduled follow-up appointment with Vaughan Browner, NP.  He has a follow-up appointment coming up with his primary care as well.  I answered all his questions today and he was in agreement with the plan. I spent 40 minutes in total face-to-face time and in reviewing records during pre-charting, more than 50% of which was spent in counseling and coordination of care, reviewing test results, reviewing medications and treatment regimen and/or in discussing or reviewing the diagnosis of peripheral neuropathy, obstructive sleep apnea, the prognosis and treatment options. Pertinent laboratory and imaging test results that were available during this visit with the patient were reviewed by me and considered in my medical decision making (see chart for details).

## 2022-08-28 NOTE — Patient Instructions (Addendum)
We can consider a referral to a neuromuscular specialist at [facility] or [facility] or [facility], if you wish.  Your leg and foot swelling may be from the amlodipine. You just stopped it recently and it will take time for the swelling to improve. Your Lyrica may also cause some swelling. If the swelling does not improve in the next 2 months, ask Dr. Renne Crigler to reduce the Lyrica some; you are on a high dose.  Your sleep apnea is a little better on a pressure of 17 cm.  Please keep your appointment with [facility] in April.

## 2022-08-29 NOTE — Progress Notes (Signed)
New, Doristine Mango, RN; Milon Dikes; Marveen Reeks Received, Thank you!     Previous Messages    ----- Message ----- From: Guy Begin, RN Sent: 08/28/2022   1:21 PM EST To: [name]; [name]; [name]; * Subject: increase set pressure to 18 EPR 3.            Jeffrey Luna, 66 y.o., 1956-01-18 MRN: [id number]   I changed in airview (pended )  FYI   Thanks  Andrey Campanile RN

## 2022-09-03 DIAGNOSIS — E876 Hypokalemia: Secondary | ICD-10-CM | POA: Diagnosis not present

## 2022-09-03 DIAGNOSIS — I10 Essential (primary) hypertension: Secondary | ICD-10-CM | POA: Diagnosis not present

## 2022-09-18 DIAGNOSIS — I10 Essential (primary) hypertension: Secondary | ICD-10-CM | POA: Diagnosis not present

## 2022-09-18 DIAGNOSIS — J0100 Acute maxillary sinusitis, unspecified: Secondary | ICD-10-CM | POA: Diagnosis not present

## 2022-09-18 DIAGNOSIS — J069 Acute upper respiratory infection, unspecified: Secondary | ICD-10-CM | POA: Diagnosis not present

## 2022-09-26 DIAGNOSIS — E876 Hypokalemia: Secondary | ICD-10-CM | POA: Diagnosis not present

## 2022-11-12 DIAGNOSIS — G4733 Obstructive sleep apnea (adult) (pediatric): Secondary | ICD-10-CM | POA: Diagnosis not present

## 2022-11-20 DIAGNOSIS — G4733 Obstructive sleep apnea (adult) (pediatric): Secondary | ICD-10-CM | POA: Diagnosis not present

## 2022-11-27 DIAGNOSIS — A0811 Acute gastroenteropathy due to Norwalk agent: Secondary | ICD-10-CM | POA: Diagnosis not present

## 2022-11-27 DIAGNOSIS — I700 Atherosclerosis of aorta: Secondary | ICD-10-CM | POA: Diagnosis not present

## 2022-11-27 DIAGNOSIS — A0839 Other viral enteritis: Secondary | ICD-10-CM | POA: Diagnosis not present

## 2022-11-27 DIAGNOSIS — R1110 Vomiting, unspecified: Secondary | ICD-10-CM | POA: Diagnosis not present

## 2022-11-27 DIAGNOSIS — R112 Nausea with vomiting, unspecified: Secondary | ICD-10-CM | POA: Diagnosis not present

## 2022-11-27 DIAGNOSIS — R1032 Left lower quadrant pain: Secondary | ICD-10-CM | POA: Diagnosis not present

## 2022-11-27 DIAGNOSIS — E876 Hypokalemia: Secondary | ICD-10-CM | POA: Diagnosis not present

## 2022-11-27 DIAGNOSIS — R197 Diarrhea, unspecified: Secondary | ICD-10-CM | POA: Diagnosis not present

## 2022-11-27 DIAGNOSIS — E860 Dehydration: Secondary | ICD-10-CM | POA: Diagnosis not present

## 2022-11-27 DIAGNOSIS — I10 Essential (primary) hypertension: Secondary | ICD-10-CM | POA: Diagnosis not present

## 2022-12-04 ENCOUNTER — Telehealth: Payer: Self-pay

## 2022-12-04 NOTE — Telephone Encounter (Signed)
        Patient  visited [place] on 2/27    Telephone encounter attempt :  1st  A HIPAA compliant voice message was left requesting a return call.  Instructed patient to call back     [place] [phone] [address], [place], [place] [address] Phone: [phone] Email: [email or web address]

## 2022-12-05 ENCOUNTER — Telehealth: Payer: Self-pay

## 2022-12-05 NOTE — Telephone Encounter (Signed)
        Patient  visited [place] on 2/27     Telephone encounter attempt :  2nd  A HIPAA compliant voice message was left requesting a return call.  Instructed patient to call back   [facility] [phone] [address], [place], [place] [address] Phone: [phone] Email: [email or web address]

## 2022-12-24 DIAGNOSIS — D1801 Hemangioma of skin and subcutaneous tissue: Secondary | ICD-10-CM | POA: Diagnosis not present

## 2022-12-30 DIAGNOSIS — J069 Acute upper respiratory infection, unspecified: Secondary | ICD-10-CM | POA: Diagnosis not present

## 2022-12-30 DIAGNOSIS — R0981 Nasal congestion: Secondary | ICD-10-CM | POA: Diagnosis not present

## 2022-12-30 DIAGNOSIS — R051 Acute cough: Secondary | ICD-10-CM | POA: Diagnosis not present

## 2023-01-15 NOTE — Progress Notes (Unsigned)
PATIENT: Jeffrey Luna DOB: [birth year]  REASON FOR VISIT: follow up HISTORY FROM: patient PRIMARY NEUROLOGIST: Dr. Frances Furbish  Chief Complaint  Patient presents with   Follow-up    Rm 5, alone, patient here for follow up with CPAP and neuropathy. Patient states nothing helps with neuropathic pain. Sleeps well with machine. ED visit end of Feb with Norovirus ESS 6     HISTORY OF PRESENT ILLNESS: Today 01/16/23:  Jeffrey Luna is a 67 y.o. male with a history of OSA on CPAP and Neuropathy. Returns today for follow-up.  Reports that CPAP is working well.  He does not notice any issues however his download does show that his residual AHI has increased after the recent increase in pressure.  Neuropathy: went to a neuropathy specialist in [place] and spent 8000 dollars.  Reports that it was not helpful reports that he does not have sharp shooting pain just numbness. When he is on his feet a lot they will swell and that causes discomfort.  Currently on Lyrica and he find that beneficial    07/03/22: Jeffrey Luna is a 67 year old male with a history of obstructive sleep apnea on CPAP.  He returns today for follow-up.  He has CPAP titration in the past that showed pressure of 15 treated his apnea the best.  His download is below.  He is still having apneic events although he reports that he is sleeping well.  Reports that he can tell a difference since he started using the machine.  His download is below    3/29/23Mr. Luna is a 67 year old male with a history of obstructive sleep apnea on CPAP and neuropathy.  He returns today for follow-up.  His download is below.  He states that he still feels that he does not get enough air at night.  However he has noticed the benefit since he started CPAP.  States that he is no longer falling asleep at stop lights or while eating dinner.  He states that some nights he does fall asleep in his chair and will wake up hours later and put the CPAP on and go to  bed.  At the last visit Dr. Frances Furbish adjusted his pressure however his residual AHI increased rather than decreased.   At the last visit the patient was scheduled for nerve conductions that did show polyneuropathy.  Lab work showed abnormal ANA and was referred to rheumatologist at [facility].  Burning and tingling in the feet. Worse when he is walking better at rest. He does not feel his symptoms are worse. Currently taking Lyrica 150 BID and reports that he does help. PCP is managing lyrica.      REVIEW OF SYSTEMS: Out of a complete 14 system review of symptoms, the patient complains only of the following symptoms, and all other reviewed systems are negative.    ALLERGIES: Allergies  Allergen Reactions   Prozac [Fluoxetine]     Heart Rate increased     HOME MEDICATIONS: Outpatient Medications Prior to Visit  Medication Sig Dispense Refill   atorvastatin (LIPITOR) 40 MG tablet Take 1 tablet by mouth daily.     Multiple Vitamins-Minerals (PRESERVISION AREDS PO) Take by mouth.     Multiple Vitamins-Minerals (ZINC PO) Take by mouth.     pantoprazole (PROTONIX) 40 MG tablet Take 40 mg by mouth 2 (two) times daily.     pregabalin (LYRICA) 200 MG capsule Take 200 mg by mouth 3 (three) times daily.     RESTASIS 0.05 %  ophthalmic emulsion      amLODipine (NORVASC) 5 MG tablet Take 5 mg by mouth daily. (Patient not taking: Reported on 08/28/2022)     chlorthalidone (HYGROTON) 25 MG tablet Take 25 mg by mouth daily. (Patient not taking: Reported on 01/16/2023)     SENNA CO by Combination route. (Patient not taking: Reported on 08/28/2022)     triamterene-hydrochlorothiazide (MAXZIDE-25) 37.5-25 MG tablet Take 1 tablet by mouth every morning. (Patient not taking: Reported on 08/28/2022)     pregabalin (LYRICA) 150 MG capsule Take 1 capsule by mouth 2 (two) times daily.     No facility-administered medications prior to visit.    PAST MEDICAL HISTORY: Past Medical History:   Diagnosis Date   High blood pressure    Neuropathy    Norovirus    Snoring     PAST SURGICAL HISTORY: Past Surgical History:  Procedure Laterality Date   NASAL SINUS SURGERY      FAMILY HISTORY: Family History  Problem Relation Age of Onset   Dementia Mother    Heart attack Father    Neuropathy Father    Sleep apnea Neg Hx     SOCIAL HISTORY: Social History   Socioeconomic History   Marital status: Married    Spouse name: Not on file   Number of children: Not on file   Years of education: Not on file   Highest education level: Not on file  Occupational History   Not on file  Tobacco Use   Smoking status: Former    Types: Cigarettes    Quit date: 2010    Years since quitting: 14.3   Smokeless tobacco: Never  Vaping Use   Vaping Use: Never used  Substance and Sexual Activity   Alcohol use: Never   Drug use: Never   Sexual activity: Not on file  Other Topics Concern   Not on file  Social History Narrative   Right Handed   "Lots of caffiene" per patient   Lives with wife   Social Determinants of Health   Financial Resource Strain: Not on file  Food Insecurity: Not on file  Transportation Needs: Not on file  Physical Activity: Not on file  Stress: Not on file  Social Connections: Not on file  Intimate Partner Violence: Not on file      PHYSICAL EXAM  Vitals:   01/16/23 1054  BP: 123/73  Pulse: (!) 53  Weight: 240 lb (108.9 kg)  Height: 6' (1.829 m)   Body mass index is 32.55 kg/m.  Generalized: Well developed, in no acute distress  Chest: Lungs clear to auscultation bilaterally  Neurological examination  Mentation: Alert oriented to time, place, history taking. Follows all commands speech and language fluent Cranial nerve II-XII: Facial symmetry noted  DIAGNOSTIC DATA (LABS, IMAGING, TESTING) - I reviewed patient records, labs, notes, testing and imaging myself where available.      Component Value Date/Time   NA 142 05/15/2021  1007   K 4.5 05/15/2021 1007   CL 103 05/15/2021 1007   CO2 24 05/15/2021 1007   GLUCOSE 101 (H) 05/15/2021 1007   BUN 14 05/15/2021 1007   CREATININE 0.94 05/15/2021 1007   CALCIUM 9.9 05/15/2021 1007   PROT 6.9 05/15/2021 1007   ALBUMIN 4.7 05/15/2021 1007   AST 30 05/15/2021 1007   ALT 37 05/15/2021 1007   ALKPHOS 102 05/15/2021 1007   BILITOT 0.5 05/15/2021 1007    Lab Results  Component Value Date   HGBA1C 5.9 (H)  05/15/2021   Lab Results  Component Value Date   VITAMINB12 >2000 (H) 05/15/2021   Lab Results  Component Value Date   TSH 3.430 05/15/2021      ASSESSMENT AND PLAN 67 y.o. year old male  has a past medical history of High blood pressure, Neuropathy, Norovirus, and Snoring. here with:  OSA on CPAP  - CPAP compliance excellent - Residual AHI remains.  Pressure has always been adjusted and he had a CPAP titration.  I will discuss with Dr. Frances Furbish - Encourage patient to continue using CPAP nightly and > 4 hours each night  2.  Neuropathy  -Currently not having any significant pain just numbness -Continue on Lyrica prescribed by PCP  - F/U in 6 months or sooner if needed   Butch Penny, MSN, NP-C 01/16/2023, 11:08 AM [facility] [address], [address] [place], [place] [address] [phone]

## 2023-01-16 ENCOUNTER — Ambulatory Visit: Payer: PPO | Admitting: Adult Health

## 2023-01-16 ENCOUNTER — Encounter: Payer: Self-pay | Admitting: Adult Health

## 2023-01-16 VITALS — BP 123/73 | HR 53 | Ht 72.0 in | Wt 240.0 lb

## 2023-01-16 DIAGNOSIS — G4733 Obstructive sleep apnea (adult) (pediatric): Secondary | ICD-10-CM | POA: Diagnosis not present

## 2023-01-16 DIAGNOSIS — G629 Polyneuropathy, unspecified: Secondary | ICD-10-CM

## 2023-02-10 DIAGNOSIS — G4733 Obstructive sleep apnea (adult) (pediatric): Secondary | ICD-10-CM | POA: Diagnosis not present

## 2023-02-18 DIAGNOSIS — G4733 Obstructive sleep apnea (adult) (pediatric): Secondary | ICD-10-CM | POA: Diagnosis not present

## 2023-04-17 DIAGNOSIS — H6122 Impacted cerumen, left ear: Secondary | ICD-10-CM | POA: Diagnosis not present

## 2023-05-19 DIAGNOSIS — G4733 Obstructive sleep apnea (adult) (pediatric): Secondary | ICD-10-CM | POA: Diagnosis not present

## 2023-06-09 DIAGNOSIS — H40052 Ocular hypertension, left eye: Secondary | ICD-10-CM | POA: Diagnosis not present

## 2023-07-15 DIAGNOSIS — J069 Acute upper respiratory infection, unspecified: Secondary | ICD-10-CM | POA: Diagnosis not present

## 2023-07-15 DIAGNOSIS — R0981 Nasal congestion: Secondary | ICD-10-CM | POA: Diagnosis not present

## 2023-07-18 DIAGNOSIS — Z01818 Encounter for other preprocedural examination: Secondary | ICD-10-CM | POA: Diagnosis not present

## 2023-07-18 DIAGNOSIS — H25812 Combined forms of age-related cataract, left eye: Secondary | ICD-10-CM | POA: Diagnosis not present

## 2023-07-18 DIAGNOSIS — H353111 Nonexudative age-related macular degeneration, right eye, early dry stage: Secondary | ICD-10-CM | POA: Diagnosis not present

## 2023-07-18 DIAGNOSIS — H4052X4 Glaucoma secondary to other eye disorders, left eye, indeterminate stage: Secondary | ICD-10-CM | POA: Diagnosis not present

## 2023-08-12 DIAGNOSIS — G4733 Obstructive sleep apnea (adult) (pediatric): Secondary | ICD-10-CM | POA: Diagnosis not present

## 2023-08-12 DIAGNOSIS — H353111 Nonexudative age-related macular degeneration, right eye, early dry stage: Secondary | ICD-10-CM | POA: Diagnosis not present

## 2023-08-12 DIAGNOSIS — E669 Obesity, unspecified: Secondary | ICD-10-CM | POA: Diagnosis not present

## 2023-08-12 DIAGNOSIS — I10 Essential (primary) hypertension: Secondary | ICD-10-CM | POA: Diagnosis not present

## 2023-08-12 DIAGNOSIS — H21262 Iris atrophy (essential) (progressive), left eye: Secondary | ICD-10-CM | POA: Diagnosis not present

## 2023-08-12 DIAGNOSIS — Z6833 Body mass index (BMI) 33.0-33.9, adult: Secondary | ICD-10-CM | POA: Diagnosis not present

## 2023-08-12 DIAGNOSIS — H259 Unspecified age-related cataract: Secondary | ICD-10-CM | POA: Diagnosis not present

## 2023-08-12 DIAGNOSIS — H18513 Endothelial corneal dystrophy, bilateral: Secondary | ICD-10-CM | POA: Diagnosis not present

## 2023-08-12 DIAGNOSIS — H25812 Combined forms of age-related cataract, left eye: Secondary | ICD-10-CM | POA: Diagnosis not present

## 2023-08-12 DIAGNOSIS — Z79899 Other long term (current) drug therapy: Secondary | ICD-10-CM | POA: Diagnosis not present

## 2023-08-18 DIAGNOSIS — G4733 Obstructive sleep apnea (adult) (pediatric): Secondary | ICD-10-CM | POA: Diagnosis not present

## 2023-08-25 DIAGNOSIS — G4733 Obstructive sleep apnea (adult) (pediatric): Secondary | ICD-10-CM | POA: Diagnosis not present

## 2023-09-02 DIAGNOSIS — Z125 Encounter for screening for malignant neoplasm of prostate: Secondary | ICD-10-CM | POA: Diagnosis not present

## 2023-09-02 DIAGNOSIS — I10 Essential (primary) hypertension: Secondary | ICD-10-CM | POA: Diagnosis not present

## 2023-09-02 DIAGNOSIS — R7303 Prediabetes: Secondary | ICD-10-CM | POA: Diagnosis not present

## 2023-09-09 DIAGNOSIS — I10 Essential (primary) hypertension: Secondary | ICD-10-CM | POA: Diagnosis not present

## 2023-09-09 DIAGNOSIS — Z0000 Encounter for general adult medical examination without abnormal findings: Secondary | ICD-10-CM | POA: Diagnosis not present

## 2023-09-09 DIAGNOSIS — K219 Gastro-esophageal reflux disease without esophagitis: Secondary | ICD-10-CM | POA: Diagnosis not present

## 2023-09-09 DIAGNOSIS — L4050 Arthropathic psoriasis, unspecified: Secondary | ICD-10-CM | POA: Diagnosis not present

## 2023-09-09 DIAGNOSIS — Z23 Encounter for immunization: Secondary | ICD-10-CM | POA: Diagnosis not present

## 2023-09-09 DIAGNOSIS — E538 Deficiency of other specified B group vitamins: Secondary | ICD-10-CM | POA: Diagnosis not present

## 2023-09-09 DIAGNOSIS — G4733 Obstructive sleep apnea (adult) (pediatric): Secondary | ICD-10-CM | POA: Diagnosis not present

## 2023-09-09 DIAGNOSIS — I701 Atherosclerosis of renal artery: Secondary | ICD-10-CM | POA: Diagnosis not present

## 2023-09-09 DIAGNOSIS — G629 Polyneuropathy, unspecified: Secondary | ICD-10-CM | POA: Diagnosis not present

## 2023-09-09 DIAGNOSIS — L2084 Intrinsic (allergic) eczema: Secondary | ICD-10-CM | POA: Diagnosis not present

## 2023-09-09 DIAGNOSIS — J302 Other seasonal allergic rhinitis: Secondary | ICD-10-CM | POA: Diagnosis not present

## 2023-10-07 DIAGNOSIS — G629 Polyneuropathy, unspecified: Secondary | ICD-10-CM | POA: Diagnosis not present

## 2023-10-07 DIAGNOSIS — E785 Hyperlipidemia, unspecified: Secondary | ICD-10-CM | POA: Diagnosis not present

## 2023-10-07 DIAGNOSIS — E1169 Type 2 diabetes mellitus with other specified complication: Secondary | ICD-10-CM | POA: Diagnosis not present

## 2023-10-07 DIAGNOSIS — I10 Essential (primary) hypertension: Secondary | ICD-10-CM | POA: Diagnosis not present

## 2023-12-05 DIAGNOSIS — G4733 Obstructive sleep apnea (adult) (pediatric): Secondary | ICD-10-CM | POA: Diagnosis not present

## 2023-12-19 DIAGNOSIS — H401122 Primary open-angle glaucoma, left eye, moderate stage: Secondary | ICD-10-CM | POA: Diagnosis not present

## 2024-01-06 DIAGNOSIS — I10 Essential (primary) hypertension: Secondary | ICD-10-CM | POA: Diagnosis not present

## 2024-01-06 DIAGNOSIS — E785 Hyperlipidemia, unspecified: Secondary | ICD-10-CM | POA: Diagnosis not present

## 2024-01-06 DIAGNOSIS — E1169 Type 2 diabetes mellitus with other specified complication: Secondary | ICD-10-CM | POA: Diagnosis not present

## 2024-01-06 DIAGNOSIS — G629 Polyneuropathy, unspecified: Secondary | ICD-10-CM | POA: Diagnosis not present

## 2024-01-20 DIAGNOSIS — L400 Psoriasis vulgaris: Secondary | ICD-10-CM | POA: Diagnosis not present

## 2024-01-22 DIAGNOSIS — L400 Psoriasis vulgaris: Secondary | ICD-10-CM | POA: Diagnosis not present

## 2024-01-22 DIAGNOSIS — Z79899 Other long term (current) drug therapy: Secondary | ICD-10-CM | POA: Diagnosis not present

## 2024-02-24 DIAGNOSIS — G4733 Obstructive sleep apnea (adult) (pediatric): Secondary | ICD-10-CM | POA: Diagnosis not present

## 2024-03-01 DIAGNOSIS — G4733 Obstructive sleep apnea (adult) (pediatric): Secondary | ICD-10-CM | POA: Diagnosis not present

## 2024-04-06 DIAGNOSIS — E1169 Type 2 diabetes mellitus with other specified complication: Secondary | ICD-10-CM | POA: Diagnosis not present

## 2024-04-06 DIAGNOSIS — I10 Essential (primary) hypertension: Secondary | ICD-10-CM | POA: Diagnosis not present

## 2024-04-06 DIAGNOSIS — E785 Hyperlipidemia, unspecified: Secondary | ICD-10-CM | POA: Diagnosis not present

## 2024-05-28 DIAGNOSIS — G4733 Obstructive sleep apnea (adult) (pediatric): Secondary | ICD-10-CM | POA: Diagnosis not present

## 2024-06-23 DIAGNOSIS — H401122 Primary open-angle glaucoma, left eye, moderate stage: Secondary | ICD-10-CM | POA: Diagnosis not present

## 2024-07-06 DIAGNOSIS — I10 Essential (primary) hypertension: Secondary | ICD-10-CM | POA: Diagnosis not present

## 2024-07-06 DIAGNOSIS — Z79899 Other long term (current) drug therapy: Secondary | ICD-10-CM | POA: Diagnosis not present

## 2024-07-06 DIAGNOSIS — E1169 Type 2 diabetes mellitus with other specified complication: Secondary | ICD-10-CM | POA: Diagnosis not present

## 2024-07-06 DIAGNOSIS — E785 Hyperlipidemia, unspecified: Secondary | ICD-10-CM | POA: Diagnosis not present

## 2024-07-06 DIAGNOSIS — E1141 Type 2 diabetes mellitus with diabetic mononeuropathy: Secondary | ICD-10-CM | POA: Diagnosis not present

## 2024-08-30 DIAGNOSIS — G4733 Obstructive sleep apnea (adult) (pediatric): Secondary | ICD-10-CM | POA: Diagnosis not present
